# Patient Record
Sex: Male | Born: 1940 | Race: White | Hispanic: No | Marital: Married | Smoking: Never smoker
Health system: Southern US, Community
[De-identification: ages and names within clinical notes are randomized; demographics above are authoritative.]

## PROBLEM LIST (undated history)

## (undated) DIAGNOSIS — K449 Diaphragmatic hernia without obstruction or gangrene: Secondary | ICD-10-CM

## (undated) DIAGNOSIS — J449 Chronic obstructive pulmonary disease, unspecified: Secondary | ICD-10-CM

## (undated) DIAGNOSIS — D332 Benign neoplasm of brain, unspecified: Secondary | ICD-10-CM

## (undated) DIAGNOSIS — E039 Hypothyroidism, unspecified: Secondary | ICD-10-CM

## (undated) DIAGNOSIS — J45909 Unspecified asthma, uncomplicated: Secondary | ICD-10-CM

## (undated) DIAGNOSIS — C61 Malignant neoplasm of prostate: Secondary | ICD-10-CM

## (undated) DIAGNOSIS — E559 Vitamin D deficiency, unspecified: Secondary | ICD-10-CM

## (undated) HISTORY — DX: Chronic obstructive pulmonary disease, unspecified: J44.9

## (undated) HISTORY — DX: Hypothyroidism, unspecified: E03.9

## (undated) HISTORY — PX: PROSTATECTOMY: SHX69

## (undated) HISTORY — DX: Diaphragmatic hernia without obstruction or gangrene: K44.9

## (undated) HISTORY — DX: Vitamin D deficiency, unspecified: E55.9

## (undated) HISTORY — DX: Benign neoplasm of brain, unspecified: D33.2

## (undated) HISTORY — DX: Unspecified asthma, uncomplicated: J45.909

## (undated) HISTORY — DX: Malignant neoplasm of prostate: C61

## (undated) HISTORY — PX: BRAIN SURGERY: SHX531

---
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
## 1997-04-03 HISTORY — PX: BRAIN SURGERY: SHX531

## 1997-09-18 ENCOUNTER — Inpatient Hospital Stay (HOSPITAL_COMMUNITY): Admission: RE | Admit: 1997-09-18 | Discharge: 1997-09-21 | Payer: Self-pay | Admitting: Neurosurgery

## 1997-10-22 ENCOUNTER — Ambulatory Visit (HOSPITAL_COMMUNITY): Admission: RE | Admit: 1997-10-22 | Discharge: 1997-10-22 | Payer: Self-pay | Admitting: Neurosurgery

## 1997-11-20 ENCOUNTER — Ambulatory Visit (HOSPITAL_COMMUNITY): Admission: RE | Admit: 1997-11-20 | Discharge: 1997-11-20 | Payer: Self-pay | Admitting: Neurosurgery

## 2004-08-11 ENCOUNTER — Ambulatory Visit: Payer: Self-pay | Admitting: Oncology

## 2010-02-01 HISTORY — PX: CATARACT EXTRACTION: SUR2

## 2010-04-03 HISTORY — PX: CATARACT EXTRACTION: SUR2

## 2013-11-21 DIAGNOSIS — R972 Elevated prostate specific antigen [PSA]: Secondary | ICD-10-CM | POA: Insufficient documentation

## 2013-11-21 DIAGNOSIS — N138 Other obstructive and reflux uropathy: Secondary | ICD-10-CM | POA: Insufficient documentation

## 2013-11-21 DIAGNOSIS — B351 Tinea unguium: Secondary | ICD-10-CM

## 2013-11-21 DIAGNOSIS — M2040 Other hammer toe(s) (acquired), unspecified foot: Secondary | ICD-10-CM

## 2013-11-21 DIAGNOSIS — N401 Enlarged prostate with lower urinary tract symptoms: Secondary | ICD-10-CM | POA: Insufficient documentation

## 2013-11-21 DIAGNOSIS — M2010 Hallux valgus (acquired), unspecified foot: Secondary | ICD-10-CM | POA: Insufficient documentation

## 2013-11-21 HISTORY — DX: Other obstructive and reflux uropathy: N13.8

## 2013-11-21 HISTORY — DX: Hallux valgus (acquired), unspecified foot: M20.10

## 2013-11-21 HISTORY — DX: Tinea unguium: B35.1

## 2013-11-21 HISTORY — DX: Elevated prostate specific antigen (PSA): R97.20

## 2013-11-21 HISTORY — DX: Benign prostatic hyperplasia with lower urinary tract symptoms: N40.1

## 2013-11-21 HISTORY — DX: Other hammer toe(s) (acquired), unspecified foot: M20.40

## 2014-05-01 DIAGNOSIS — R972 Elevated prostate specific antigen [PSA]: Secondary | ICD-10-CM | POA: Diagnosis not present

## 2014-05-01 DIAGNOSIS — N401 Enlarged prostate with lower urinary tract symptoms: Secondary | ICD-10-CM | POA: Diagnosis not present

## 2014-05-08 DIAGNOSIS — R972 Elevated prostate specific antigen [PSA]: Secondary | ICD-10-CM | POA: Diagnosis not present

## 2014-06-17 DIAGNOSIS — N202 Calculus of kidney with calculus of ureter: Secondary | ICD-10-CM | POA: Diagnosis not present

## 2014-06-17 DIAGNOSIS — R112 Nausea with vomiting, unspecified: Secondary | ICD-10-CM | POA: Diagnosis not present

## 2014-06-17 DIAGNOSIS — M549 Dorsalgia, unspecified: Secondary | ICD-10-CM | POA: Diagnosis not present

## 2014-06-17 DIAGNOSIS — N200 Calculus of kidney: Secondary | ICD-10-CM | POA: Diagnosis not present

## 2014-06-17 DIAGNOSIS — R10814 Left lower quadrant abdominal tenderness: Secondary | ICD-10-CM | POA: Diagnosis not present

## 2014-06-17 DIAGNOSIS — N201 Calculus of ureter: Secondary | ICD-10-CM | POA: Diagnosis not present

## 2014-06-17 DIAGNOSIS — R1032 Left lower quadrant pain: Secondary | ICD-10-CM | POA: Diagnosis not present

## 2014-11-20 DIAGNOSIS — R972 Elevated prostate specific antigen [PSA]: Secondary | ICD-10-CM | POA: Diagnosis not present

## 2014-11-20 DIAGNOSIS — J018 Other acute sinusitis: Secondary | ICD-10-CM | POA: Diagnosis not present

## 2014-11-20 DIAGNOSIS — J0100 Acute maxillary sinusitis, unspecified: Secondary | ICD-10-CM | POA: Diagnosis not present

## 2014-11-27 DIAGNOSIS — N401 Enlarged prostate with lower urinary tract symptoms: Secondary | ICD-10-CM | POA: Diagnosis not present

## 2014-11-27 DIAGNOSIS — R972 Elevated prostate specific antigen [PSA]: Secondary | ICD-10-CM | POA: Diagnosis not present

## 2014-12-22 DIAGNOSIS — M5442 Lumbago with sciatica, left side: Secondary | ICD-10-CM | POA: Diagnosis not present

## 2014-12-25 DIAGNOSIS — M6281 Muscle weakness (generalized): Secondary | ICD-10-CM | POA: Diagnosis not present

## 2014-12-25 DIAGNOSIS — M2560 Stiffness of unspecified joint, not elsewhere classified: Secondary | ICD-10-CM | POA: Diagnosis not present

## 2014-12-25 DIAGNOSIS — Z23 Encounter for immunization: Secondary | ICD-10-CM | POA: Diagnosis not present

## 2014-12-25 DIAGNOSIS — M5442 Lumbago with sciatica, left side: Secondary | ICD-10-CM | POA: Diagnosis not present

## 2014-12-25 DIAGNOSIS — M79605 Pain in left leg: Secondary | ICD-10-CM | POA: Diagnosis not present

## 2014-12-25 DIAGNOSIS — R293 Abnormal posture: Secondary | ICD-10-CM | POA: Diagnosis not present

## 2014-12-25 DIAGNOSIS — R2689 Other abnormalities of gait and mobility: Secondary | ICD-10-CM | POA: Diagnosis not present

## 2014-12-29 DIAGNOSIS — M5442 Lumbago with sciatica, left side: Secondary | ICD-10-CM | POA: Diagnosis not present

## 2014-12-29 DIAGNOSIS — R293 Abnormal posture: Secondary | ICD-10-CM | POA: Diagnosis not present

## 2014-12-29 DIAGNOSIS — M79605 Pain in left leg: Secondary | ICD-10-CM | POA: Diagnosis not present

## 2014-12-29 DIAGNOSIS — M2560 Stiffness of unspecified joint, not elsewhere classified: Secondary | ICD-10-CM | POA: Diagnosis not present

## 2014-12-29 DIAGNOSIS — M6281 Muscle weakness (generalized): Secondary | ICD-10-CM | POA: Diagnosis not present

## 2014-12-29 DIAGNOSIS — R2689 Other abnormalities of gait and mobility: Secondary | ICD-10-CM | POA: Diagnosis not present

## 2015-01-01 DIAGNOSIS — M6281 Muscle weakness (generalized): Secondary | ICD-10-CM | POA: Diagnosis not present

## 2015-01-01 DIAGNOSIS — M79605 Pain in left leg: Secondary | ICD-10-CM | POA: Diagnosis not present

## 2015-01-01 DIAGNOSIS — M5442 Lumbago with sciatica, left side: Secondary | ICD-10-CM | POA: Diagnosis not present

## 2015-01-01 DIAGNOSIS — M2560 Stiffness of unspecified joint, not elsewhere classified: Secondary | ICD-10-CM | POA: Diagnosis not present

## 2015-01-01 DIAGNOSIS — R2689 Other abnormalities of gait and mobility: Secondary | ICD-10-CM | POA: Diagnosis not present

## 2015-01-01 DIAGNOSIS — R293 Abnormal posture: Secondary | ICD-10-CM | POA: Diagnosis not present

## 2015-01-05 DIAGNOSIS — R293 Abnormal posture: Secondary | ICD-10-CM | POA: Diagnosis not present

## 2015-01-05 DIAGNOSIS — R2689 Other abnormalities of gait and mobility: Secondary | ICD-10-CM | POA: Diagnosis not present

## 2015-01-05 DIAGNOSIS — M545 Low back pain: Secondary | ICD-10-CM | POA: Diagnosis not present

## 2015-01-05 DIAGNOSIS — M79605 Pain in left leg: Secondary | ICD-10-CM | POA: Diagnosis not present

## 2015-01-05 DIAGNOSIS — M5442 Lumbago with sciatica, left side: Secondary | ICD-10-CM | POA: Diagnosis not present

## 2015-01-05 DIAGNOSIS — M2560 Stiffness of unspecified joint, not elsewhere classified: Secondary | ICD-10-CM | POA: Diagnosis not present

## 2015-01-05 DIAGNOSIS — M6281 Muscle weakness (generalized): Secondary | ICD-10-CM | POA: Diagnosis not present

## 2015-01-08 DIAGNOSIS — M79605 Pain in left leg: Secondary | ICD-10-CM | POA: Diagnosis not present

## 2015-01-08 DIAGNOSIS — R2689 Other abnormalities of gait and mobility: Secondary | ICD-10-CM | POA: Diagnosis not present

## 2015-01-08 DIAGNOSIS — M2560 Stiffness of unspecified joint, not elsewhere classified: Secondary | ICD-10-CM | POA: Diagnosis not present

## 2015-01-08 DIAGNOSIS — M5442 Lumbago with sciatica, left side: Secondary | ICD-10-CM | POA: Diagnosis not present

## 2015-01-08 DIAGNOSIS — R293 Abnormal posture: Secondary | ICD-10-CM | POA: Diagnosis not present

## 2015-01-08 DIAGNOSIS — M545 Low back pain: Secondary | ICD-10-CM | POA: Diagnosis not present

## 2015-01-08 DIAGNOSIS — M6281 Muscle weakness (generalized): Secondary | ICD-10-CM | POA: Diagnosis not present

## 2015-01-12 DIAGNOSIS — M79605 Pain in left leg: Secondary | ICD-10-CM | POA: Diagnosis not present

## 2015-01-12 DIAGNOSIS — M6281 Muscle weakness (generalized): Secondary | ICD-10-CM | POA: Diagnosis not present

## 2015-01-12 DIAGNOSIS — R293 Abnormal posture: Secondary | ICD-10-CM | POA: Diagnosis not present

## 2015-01-12 DIAGNOSIS — M2560 Stiffness of unspecified joint, not elsewhere classified: Secondary | ICD-10-CM | POA: Diagnosis not present

## 2015-01-12 DIAGNOSIS — R2689 Other abnormalities of gait and mobility: Secondary | ICD-10-CM | POA: Diagnosis not present

## 2015-01-12 DIAGNOSIS — M545 Low back pain: Secondary | ICD-10-CM | POA: Diagnosis not present

## 2015-01-12 DIAGNOSIS — M5442 Lumbago with sciatica, left side: Secondary | ICD-10-CM | POA: Diagnosis not present

## 2015-01-15 DIAGNOSIS — M545 Low back pain: Secondary | ICD-10-CM | POA: Diagnosis not present

## 2015-01-15 DIAGNOSIS — R2689 Other abnormalities of gait and mobility: Secondary | ICD-10-CM | POA: Diagnosis not present

## 2015-01-15 DIAGNOSIS — M79605 Pain in left leg: Secondary | ICD-10-CM | POA: Diagnosis not present

## 2015-01-15 DIAGNOSIS — M2560 Stiffness of unspecified joint, not elsewhere classified: Secondary | ICD-10-CM | POA: Diagnosis not present

## 2015-01-15 DIAGNOSIS — M6281 Muscle weakness (generalized): Secondary | ICD-10-CM | POA: Diagnosis not present

## 2015-01-15 DIAGNOSIS — R293 Abnormal posture: Secondary | ICD-10-CM | POA: Diagnosis not present

## 2015-01-15 DIAGNOSIS — M5442 Lumbago with sciatica, left side: Secondary | ICD-10-CM | POA: Diagnosis not present

## 2015-01-18 DIAGNOSIS — M79605 Pain in left leg: Secondary | ICD-10-CM | POA: Diagnosis not present

## 2015-01-18 DIAGNOSIS — M2560 Stiffness of unspecified joint, not elsewhere classified: Secondary | ICD-10-CM | POA: Diagnosis not present

## 2015-01-18 DIAGNOSIS — M6281 Muscle weakness (generalized): Secondary | ICD-10-CM | POA: Diagnosis not present

## 2015-01-18 DIAGNOSIS — M5442 Lumbago with sciatica, left side: Secondary | ICD-10-CM | POA: Diagnosis not present

## 2015-01-18 DIAGNOSIS — R293 Abnormal posture: Secondary | ICD-10-CM | POA: Diagnosis not present

## 2015-01-18 DIAGNOSIS — R2689 Other abnormalities of gait and mobility: Secondary | ICD-10-CM | POA: Diagnosis not present

## 2015-01-18 DIAGNOSIS — M545 Low back pain: Secondary | ICD-10-CM | POA: Diagnosis not present

## 2015-01-20 DIAGNOSIS — R2689 Other abnormalities of gait and mobility: Secondary | ICD-10-CM | POA: Diagnosis not present

## 2015-01-20 DIAGNOSIS — M5442 Lumbago with sciatica, left side: Secondary | ICD-10-CM | POA: Diagnosis not present

## 2015-01-20 DIAGNOSIS — M545 Low back pain: Secondary | ICD-10-CM | POA: Diagnosis not present

## 2015-01-20 DIAGNOSIS — R293 Abnormal posture: Secondary | ICD-10-CM | POA: Diagnosis not present

## 2015-01-20 DIAGNOSIS — M79605 Pain in left leg: Secondary | ICD-10-CM | POA: Diagnosis not present

## 2015-01-20 DIAGNOSIS — M6281 Muscle weakness (generalized): Secondary | ICD-10-CM | POA: Diagnosis not present

## 2015-01-20 DIAGNOSIS — M2560 Stiffness of unspecified joint, not elsewhere classified: Secondary | ICD-10-CM | POA: Diagnosis not present

## 2015-01-22 DIAGNOSIS — M4317 Spondylolisthesis, lumbosacral region: Secondary | ICD-10-CM | POA: Diagnosis not present

## 2015-01-22 DIAGNOSIS — M5442 Lumbago with sciatica, left side: Secondary | ICD-10-CM | POA: Diagnosis not present

## 2015-01-22 DIAGNOSIS — M5416 Radiculopathy, lumbar region: Secondary | ICD-10-CM | POA: Diagnosis not present

## 2015-01-26 DIAGNOSIS — M5442 Lumbago with sciatica, left side: Secondary | ICD-10-CM | POA: Diagnosis not present

## 2015-01-26 DIAGNOSIS — M79605 Pain in left leg: Secondary | ICD-10-CM | POA: Diagnosis not present

## 2015-01-26 DIAGNOSIS — R293 Abnormal posture: Secondary | ICD-10-CM | POA: Diagnosis not present

## 2015-01-26 DIAGNOSIS — M545 Low back pain: Secondary | ICD-10-CM | POA: Diagnosis not present

## 2015-01-26 DIAGNOSIS — R2689 Other abnormalities of gait and mobility: Secondary | ICD-10-CM | POA: Diagnosis not present

## 2015-01-26 DIAGNOSIS — M2560 Stiffness of unspecified joint, not elsewhere classified: Secondary | ICD-10-CM | POA: Diagnosis not present

## 2015-01-26 DIAGNOSIS — M6281 Muscle weakness (generalized): Secondary | ICD-10-CM | POA: Diagnosis not present

## 2015-01-29 DIAGNOSIS — R2689 Other abnormalities of gait and mobility: Secondary | ICD-10-CM | POA: Diagnosis not present

## 2015-01-29 DIAGNOSIS — M2560 Stiffness of unspecified joint, not elsewhere classified: Secondary | ICD-10-CM | POA: Diagnosis not present

## 2015-01-29 DIAGNOSIS — M79605 Pain in left leg: Secondary | ICD-10-CM | POA: Diagnosis not present

## 2015-01-29 DIAGNOSIS — M545 Low back pain: Secondary | ICD-10-CM | POA: Diagnosis not present

## 2015-01-29 DIAGNOSIS — R293 Abnormal posture: Secondary | ICD-10-CM | POA: Diagnosis not present

## 2015-01-29 DIAGNOSIS — M5442 Lumbago with sciatica, left side: Secondary | ICD-10-CM | POA: Diagnosis not present

## 2015-01-29 DIAGNOSIS — M6281 Muscle weakness (generalized): Secondary | ICD-10-CM | POA: Diagnosis not present

## 2015-02-03 DIAGNOSIS — R2689 Other abnormalities of gait and mobility: Secondary | ICD-10-CM | POA: Diagnosis not present

## 2015-02-03 DIAGNOSIS — M6281 Muscle weakness (generalized): Secondary | ICD-10-CM | POA: Diagnosis not present

## 2015-02-03 DIAGNOSIS — R293 Abnormal posture: Secondary | ICD-10-CM | POA: Diagnosis not present

## 2015-02-03 DIAGNOSIS — M2560 Stiffness of unspecified joint, not elsewhere classified: Secondary | ICD-10-CM | POA: Diagnosis not present

## 2015-02-03 DIAGNOSIS — M79605 Pain in left leg: Secondary | ICD-10-CM | POA: Diagnosis not present

## 2015-02-03 DIAGNOSIS — M5442 Lumbago with sciatica, left side: Secondary | ICD-10-CM | POA: Diagnosis not present

## 2015-02-05 DIAGNOSIS — R293 Abnormal posture: Secondary | ICD-10-CM | POA: Diagnosis not present

## 2015-02-05 DIAGNOSIS — M6281 Muscle weakness (generalized): Secondary | ICD-10-CM | POA: Diagnosis not present

## 2015-02-05 DIAGNOSIS — R2689 Other abnormalities of gait and mobility: Secondary | ICD-10-CM | POA: Diagnosis not present

## 2015-02-05 DIAGNOSIS — M5442 Lumbago with sciatica, left side: Secondary | ICD-10-CM | POA: Diagnosis not present

## 2015-02-05 DIAGNOSIS — M2560 Stiffness of unspecified joint, not elsewhere classified: Secondary | ICD-10-CM | POA: Diagnosis not present

## 2015-02-05 DIAGNOSIS — M79605 Pain in left leg: Secondary | ICD-10-CM | POA: Diagnosis not present

## 2015-02-10 DIAGNOSIS — R2689 Other abnormalities of gait and mobility: Secondary | ICD-10-CM | POA: Diagnosis not present

## 2015-02-10 DIAGNOSIS — R293 Abnormal posture: Secondary | ICD-10-CM | POA: Diagnosis not present

## 2015-02-10 DIAGNOSIS — M2560 Stiffness of unspecified joint, not elsewhere classified: Secondary | ICD-10-CM | POA: Diagnosis not present

## 2015-02-10 DIAGNOSIS — M5442 Lumbago with sciatica, left side: Secondary | ICD-10-CM | POA: Diagnosis not present

## 2015-02-10 DIAGNOSIS — M79605 Pain in left leg: Secondary | ICD-10-CM | POA: Diagnosis not present

## 2015-02-10 DIAGNOSIS — M6281 Muscle weakness (generalized): Secondary | ICD-10-CM | POA: Diagnosis not present

## 2015-02-12 DIAGNOSIS — M5442 Lumbago with sciatica, left side: Secondary | ICD-10-CM | POA: Diagnosis not present

## 2015-02-12 DIAGNOSIS — M2560 Stiffness of unspecified joint, not elsewhere classified: Secondary | ICD-10-CM | POA: Diagnosis not present

## 2015-02-12 DIAGNOSIS — R293 Abnormal posture: Secondary | ICD-10-CM | POA: Diagnosis not present

## 2015-02-12 DIAGNOSIS — M79605 Pain in left leg: Secondary | ICD-10-CM | POA: Diagnosis not present

## 2015-02-12 DIAGNOSIS — M6281 Muscle weakness (generalized): Secondary | ICD-10-CM | POA: Diagnosis not present

## 2015-02-12 DIAGNOSIS — R2689 Other abnormalities of gait and mobility: Secondary | ICD-10-CM | POA: Diagnosis not present

## 2015-02-16 DIAGNOSIS — M79605 Pain in left leg: Secondary | ICD-10-CM | POA: Diagnosis not present

## 2015-02-16 DIAGNOSIS — R2689 Other abnormalities of gait and mobility: Secondary | ICD-10-CM | POA: Diagnosis not present

## 2015-02-16 DIAGNOSIS — M6281 Muscle weakness (generalized): Secondary | ICD-10-CM | POA: Diagnosis not present

## 2015-02-16 DIAGNOSIS — M5442 Lumbago with sciatica, left side: Secondary | ICD-10-CM | POA: Diagnosis not present

## 2015-02-16 DIAGNOSIS — M2560 Stiffness of unspecified joint, not elsewhere classified: Secondary | ICD-10-CM | POA: Diagnosis not present

## 2015-02-16 DIAGNOSIS — R293 Abnormal posture: Secondary | ICD-10-CM | POA: Diagnosis not present

## 2015-03-10 DIAGNOSIS — M5442 Lumbago with sciatica, left side: Secondary | ICD-10-CM | POA: Diagnosis not present

## 2015-03-10 DIAGNOSIS — M4317 Spondylolisthesis, lumbosacral region: Secondary | ICD-10-CM | POA: Diagnosis not present

## 2015-03-10 DIAGNOSIS — M5416 Radiculopathy, lumbar region: Secondary | ICD-10-CM | POA: Diagnosis not present

## 2015-03-12 DIAGNOSIS — R972 Elevated prostate specific antigen [PSA]: Secondary | ICD-10-CM | POA: Diagnosis not present

## 2015-03-19 DIAGNOSIS — N400 Enlarged prostate without lower urinary tract symptoms: Secondary | ICD-10-CM | POA: Diagnosis not present

## 2015-03-19 DIAGNOSIS — R972 Elevated prostate specific antigen [PSA]: Secondary | ICD-10-CM | POA: Diagnosis not present

## 2015-03-30 DIAGNOSIS — R972 Elevated prostate specific antigen [PSA]: Secondary | ICD-10-CM | POA: Diagnosis not present

## 2015-03-31 DIAGNOSIS — J018 Other acute sinusitis: Secondary | ICD-10-CM | POA: Diagnosis not present

## 2015-10-15 DIAGNOSIS — R972 Elevated prostate specific antigen [PSA]: Secondary | ICD-10-CM | POA: Diagnosis not present

## 2015-10-22 DIAGNOSIS — N138 Other obstructive and reflux uropathy: Secondary | ICD-10-CM | POA: Diagnosis not present

## 2015-10-22 DIAGNOSIS — N401 Enlarged prostate with lower urinary tract symptoms: Secondary | ICD-10-CM | POA: Diagnosis not present

## 2015-10-22 DIAGNOSIS — R972 Elevated prostate specific antigen [PSA]: Secondary | ICD-10-CM | POA: Diagnosis not present

## 2015-12-27 DIAGNOSIS — R972 Elevated prostate specific antigen [PSA]: Secondary | ICD-10-CM | POA: Diagnosis not present

## 2015-12-27 DIAGNOSIS — N401 Enlarged prostate with lower urinary tract symptoms: Secondary | ICD-10-CM | POA: Diagnosis not present

## 2015-12-27 DIAGNOSIS — N400 Enlarged prostate without lower urinary tract symptoms: Secondary | ICD-10-CM | POA: Diagnosis not present

## 2015-12-27 DIAGNOSIS — C61 Malignant neoplasm of prostate: Secondary | ICD-10-CM | POA: Diagnosis not present

## 2016-01-03 DIAGNOSIS — R972 Elevated prostate specific antigen [PSA]: Secondary | ICD-10-CM | POA: Diagnosis not present

## 2016-01-03 DIAGNOSIS — N400 Enlarged prostate without lower urinary tract symptoms: Secondary | ICD-10-CM | POA: Diagnosis not present

## 2016-01-13 DIAGNOSIS — C61 Malignant neoplasm of prostate: Secondary | ICD-10-CM | POA: Diagnosis not present

## 2016-01-13 DIAGNOSIS — R972 Elevated prostate specific antigen [PSA]: Secondary | ICD-10-CM | POA: Diagnosis not present

## 2016-01-13 DIAGNOSIS — N400 Enlarged prostate without lower urinary tract symptoms: Secondary | ICD-10-CM | POA: Diagnosis not present

## 2016-01-20 DIAGNOSIS — Z23 Encounter for immunization: Secondary | ICD-10-CM | POA: Diagnosis not present

## 2016-01-25 DIAGNOSIS — R972 Elevated prostate specific antigen [PSA]: Secondary | ICD-10-CM | POA: Diagnosis not present

## 2016-02-16 DIAGNOSIS — N138 Other obstructive and reflux uropathy: Secondary | ICD-10-CM | POA: Diagnosis not present

## 2016-02-16 DIAGNOSIS — N401 Enlarged prostate with lower urinary tract symptoms: Secondary | ICD-10-CM | POA: Diagnosis not present

## 2016-02-22 DIAGNOSIS — R972 Elevated prostate specific antigen [PSA]: Secondary | ICD-10-CM | POA: Diagnosis not present

## 2016-02-22 DIAGNOSIS — N418 Other inflammatory diseases of prostate: Secondary | ICD-10-CM | POA: Diagnosis not present

## 2016-02-22 DIAGNOSIS — N401 Enlarged prostate with lower urinary tract symptoms: Secondary | ICD-10-CM | POA: Diagnosis not present

## 2016-02-22 DIAGNOSIS — C61 Malignant neoplasm of prostate: Secondary | ICD-10-CM | POA: Diagnosis not present

## 2016-02-22 HISTORY — PX: PROSTATECTOMY: SHX69

## 2016-03-10 DIAGNOSIS — N401 Enlarged prostate with lower urinary tract symptoms: Secondary | ICD-10-CM | POA: Diagnosis not present

## 2016-03-10 DIAGNOSIS — N138 Other obstructive and reflux uropathy: Secondary | ICD-10-CM | POA: Diagnosis not present

## 2016-03-21 DIAGNOSIS — C61 Malignant neoplasm of prostate: Secondary | ICD-10-CM | POA: Insufficient documentation

## 2016-04-03 HISTORY — PX: LITHOTRIPSY: SUR834

## 2016-05-10 DIAGNOSIS — N400 Enlarged prostate without lower urinary tract symptoms: Secondary | ICD-10-CM | POA: Diagnosis not present

## 2016-05-10 DIAGNOSIS — R972 Elevated prostate specific antigen [PSA]: Secondary | ICD-10-CM | POA: Diagnosis not present

## 2016-05-10 DIAGNOSIS — R8271 Bacteriuria: Secondary | ICD-10-CM | POA: Diagnosis not present

## 2016-06-21 DIAGNOSIS — Z0000 Encounter for general adult medical examination without abnormal findings: Secondary | ICD-10-CM | POA: Diagnosis not present

## 2016-06-21 DIAGNOSIS — C61 Malignant neoplasm of prostate: Secondary | ICD-10-CM | POA: Diagnosis not present

## 2016-07-05 DIAGNOSIS — N400 Enlarged prostate without lower urinary tract symptoms: Secondary | ICD-10-CM | POA: Diagnosis not present

## 2016-07-18 DIAGNOSIS — C61 Malignant neoplasm of prostate: Secondary | ICD-10-CM | POA: Diagnosis not present

## 2016-07-18 DIAGNOSIS — N200 Calculus of kidney: Secondary | ICD-10-CM | POA: Diagnosis not present

## 2016-07-18 DIAGNOSIS — Z136 Encounter for screening for cardiovascular disorders: Secondary | ICD-10-CM | POA: Diagnosis not present

## 2016-09-21 DIAGNOSIS — N400 Enlarged prostate without lower urinary tract symptoms: Secondary | ICD-10-CM | POA: Diagnosis not present

## 2016-09-21 DIAGNOSIS — R972 Elevated prostate specific antigen [PSA]: Secondary | ICD-10-CM | POA: Diagnosis not present

## 2016-09-28 DIAGNOSIS — N400 Enlarged prostate without lower urinary tract symptoms: Secondary | ICD-10-CM | POA: Diagnosis not present

## 2016-10-05 DIAGNOSIS — R109 Unspecified abdominal pain: Secondary | ICD-10-CM | POA: Diagnosis not present

## 2016-10-05 DIAGNOSIS — N200 Calculus of kidney: Secondary | ICD-10-CM | POA: Diagnosis not present

## 2016-10-06 DIAGNOSIS — D3022 Benign neoplasm of left ureter: Secondary | ICD-10-CM | POA: Diagnosis not present

## 2016-10-06 DIAGNOSIS — N2889 Other specified disorders of kidney and ureter: Secondary | ICD-10-CM | POA: Diagnosis not present

## 2016-10-06 DIAGNOSIS — C61 Malignant neoplasm of prostate: Secondary | ICD-10-CM | POA: Diagnosis not present

## 2016-10-06 DIAGNOSIS — N201 Calculus of ureter: Secondary | ICD-10-CM | POA: Diagnosis not present

## 2016-10-17 DIAGNOSIS — N200 Calculus of kidney: Secondary | ICD-10-CM | POA: Diagnosis not present

## 2016-10-20 DIAGNOSIS — N200 Calculus of kidney: Secondary | ICD-10-CM | POA: Diagnosis not present

## 2016-10-20 DIAGNOSIS — R8271 Bacteriuria: Secondary | ICD-10-CM | POA: Diagnosis not present

## 2016-10-24 DIAGNOSIS — N200 Calculus of kidney: Secondary | ICD-10-CM | POA: Diagnosis not present

## 2016-12-11 DIAGNOSIS — N400 Enlarged prostate without lower urinary tract symptoms: Secondary | ICD-10-CM | POA: Diagnosis not present

## 2016-12-18 DIAGNOSIS — N401 Enlarged prostate with lower urinary tract symptoms: Secondary | ICD-10-CM | POA: Diagnosis not present

## 2016-12-18 DIAGNOSIS — N200 Calculus of kidney: Secondary | ICD-10-CM | POA: Diagnosis not present

## 2016-12-18 DIAGNOSIS — N138 Other obstructive and reflux uropathy: Secondary | ICD-10-CM | POA: Diagnosis not present

## 2017-01-04 DIAGNOSIS — Z23 Encounter for immunization: Secondary | ICD-10-CM | POA: Diagnosis not present

## 2017-01-30 DIAGNOSIS — M778 Other enthesopathies, not elsewhere classified: Secondary | ICD-10-CM | POA: Diagnosis not present

## 2017-03-07 DIAGNOSIS — Z1331 Encounter for screening for depression: Secondary | ICD-10-CM | POA: Diagnosis not present

## 2017-03-07 DIAGNOSIS — Z125 Encounter for screening for malignant neoplasm of prostate: Secondary | ICD-10-CM | POA: Diagnosis not present

## 2017-03-07 DIAGNOSIS — E785 Hyperlipidemia, unspecified: Secondary | ICD-10-CM | POA: Diagnosis not present

## 2017-03-07 DIAGNOSIS — Z139 Encounter for screening, unspecified: Secondary | ICD-10-CM | POA: Diagnosis not present

## 2017-03-07 DIAGNOSIS — Z0000 Encounter for general adult medical examination without abnormal findings: Secondary | ICD-10-CM | POA: Diagnosis not present

## 2017-03-07 DIAGNOSIS — Z9181 History of falling: Secondary | ICD-10-CM | POA: Diagnosis not present

## 2017-06-18 DIAGNOSIS — N138 Other obstructive and reflux uropathy: Secondary | ICD-10-CM | POA: Diagnosis not present

## 2017-06-18 DIAGNOSIS — N401 Enlarged prostate with lower urinary tract symptoms: Secondary | ICD-10-CM | POA: Diagnosis not present

## 2017-06-25 DIAGNOSIS — N401 Enlarged prostate with lower urinary tract symptoms: Secondary | ICD-10-CM | POA: Diagnosis not present

## 2017-06-25 DIAGNOSIS — N138 Other obstructive and reflux uropathy: Secondary | ICD-10-CM | POA: Diagnosis not present

## 2017-06-28 DIAGNOSIS — R1031 Right lower quadrant pain: Secondary | ICD-10-CM | POA: Diagnosis not present

## 2017-12-11 DIAGNOSIS — N138 Other obstructive and reflux uropathy: Secondary | ICD-10-CM | POA: Diagnosis not present

## 2017-12-12 DIAGNOSIS — R0982 Postnasal drip: Secondary | ICD-10-CM | POA: Diagnosis not present

## 2017-12-12 DIAGNOSIS — R062 Wheezing: Secondary | ICD-10-CM | POA: Diagnosis not present

## 2017-12-18 DIAGNOSIS — N138 Other obstructive and reflux uropathy: Secondary | ICD-10-CM | POA: Diagnosis not present

## 2017-12-18 DIAGNOSIS — N200 Calculus of kidney: Secondary | ICD-10-CM | POA: Diagnosis not present

## 2018-01-08 DIAGNOSIS — J45909 Unspecified asthma, uncomplicated: Secondary | ICD-10-CM | POA: Diagnosis not present

## 2018-01-09 DIAGNOSIS — J45909 Unspecified asthma, uncomplicated: Secondary | ICD-10-CM | POA: Diagnosis not present

## 2018-01-19 DIAGNOSIS — Z23 Encounter for immunization: Secondary | ICD-10-CM | POA: Diagnosis not present

## 2018-02-01 DIAGNOSIS — R062 Wheezing: Secondary | ICD-10-CM | POA: Diagnosis not present

## 2018-02-05 DIAGNOSIS — R062 Wheezing: Secondary | ICD-10-CM | POA: Diagnosis not present

## 2018-02-09 DIAGNOSIS — J45909 Unspecified asthma, uncomplicated: Secondary | ICD-10-CM | POA: Diagnosis not present

## 2018-02-12 DIAGNOSIS — J45909 Unspecified asthma, uncomplicated: Secondary | ICD-10-CM | POA: Diagnosis not present

## 2018-03-11 DIAGNOSIS — J45909 Unspecified asthma, uncomplicated: Secondary | ICD-10-CM | POA: Diagnosis not present

## 2018-04-02 DIAGNOSIS — Z9181 History of falling: Secondary | ICD-10-CM | POA: Diagnosis not present

## 2018-04-02 DIAGNOSIS — Z0000 Encounter for general adult medical examination without abnormal findings: Secondary | ICD-10-CM | POA: Diagnosis not present

## 2018-04-02 DIAGNOSIS — Z139 Encounter for screening, unspecified: Secondary | ICD-10-CM | POA: Diagnosis not present

## 2018-04-08 DIAGNOSIS — J449 Chronic obstructive pulmonary disease, unspecified: Secondary | ICD-10-CM | POA: Diagnosis not present

## 2018-04-08 DIAGNOSIS — J441 Chronic obstructive pulmonary disease with (acute) exacerbation: Secondary | ICD-10-CM | POA: Diagnosis not present

## 2018-04-08 DIAGNOSIS — R05 Cough: Secondary | ICD-10-CM | POA: Diagnosis not present

## 2018-04-11 DIAGNOSIS — J45909 Unspecified asthma, uncomplicated: Secondary | ICD-10-CM | POA: Diagnosis not present

## 2018-04-15 DIAGNOSIS — R062 Wheezing: Secondary | ICD-10-CM | POA: Diagnosis not present

## 2018-04-15 DIAGNOSIS — J330 Polyp of nasal cavity: Secondary | ICD-10-CM | POA: Diagnosis not present

## 2018-04-17 DIAGNOSIS — J45909 Unspecified asthma, uncomplicated: Secondary | ICD-10-CM | POA: Diagnosis not present

## 2018-04-24 DIAGNOSIS — G9389 Other specified disorders of brain: Secondary | ICD-10-CM | POA: Diagnosis not present

## 2018-04-24 DIAGNOSIS — R062 Wheezing: Secondary | ICD-10-CM | POA: Diagnosis not present

## 2018-04-24 DIAGNOSIS — J328 Other chronic sinusitis: Secondary | ICD-10-CM | POA: Diagnosis not present

## 2018-04-24 DIAGNOSIS — J330 Polyp of nasal cavity: Secondary | ICD-10-CM | POA: Diagnosis not present

## 2018-04-24 DIAGNOSIS — R918 Other nonspecific abnormal finding of lung field: Secondary | ICD-10-CM | POA: Diagnosis not present

## 2018-05-12 DIAGNOSIS — J45909 Unspecified asthma, uncomplicated: Secondary | ICD-10-CM | POA: Diagnosis not present

## 2018-05-23 DIAGNOSIS — J45909 Unspecified asthma, uncomplicated: Secondary | ICD-10-CM | POA: Diagnosis not present

## 2018-05-31 ENCOUNTER — Ambulatory Visit: Payer: Medicare Other | Admitting: Pulmonary Disease

## 2018-05-31 ENCOUNTER — Encounter: Payer: Self-pay | Admitting: Pulmonary Disease

## 2018-05-31 VITALS — BP 144/80 | HR 86 | Ht 67.75 in | Wt 170.8 lb

## 2018-05-31 DIAGNOSIS — R062 Wheezing: Secondary | ICD-10-CM

## 2018-05-31 DIAGNOSIS — J4520 Mild intermittent asthma, uncomplicated: Secondary | ICD-10-CM | POA: Diagnosis not present

## 2018-05-31 NOTE — Patient Instructions (Signed)
Mild intermittent asthma: In the event of chest tightness wheezing or shortness of breath use albuterol 2 puffs every 4-6 hours as needed If he have to use albuterol more than 2 times a week need to know that If he have a severe case of coughing and wheezing again please come back to our office so that we can see you Practice good hand hygiene Stay active I am glad your immunizations are up-to-date  We will see you back in 3 months or sooner if needed

## 2018-05-31 NOTE — Progress Notes (Signed)
Synopsis: Referred in February 2020 for coughing, wheezing, mucus production  Subjective:   PATIENT ID: Ivan Day GENDER: male DOB: 1940-11-20, MRN: [id number]   HPI  Chief Complaint  Patient presents with  . Pulmonary Consult    referred by Dr. Romeo Apple (PCP) [facility] in [place] for suspected COPD & asthma; wheezing since Oct. 2019 w/ cough & congestion    Cauy says that in mid October he started wheezing > he went to his PCP and was Rx'd prednisone which helped > however the symptoms recurred so he had to go back and was treated again with prednisone > he says that after three treatments of prednisone his symptoms improved > he started using nebulized medicines for the first time > around the time he was sick he had mucus production with his cough > he has not had fever at all during this time > no sick contacts in October > no mold, mildew in the house > he has humidifier in the house that haven't changed  He has never smoked cigarettes.  He worked in [employer].  He worked for [employer].  He was in Tax inspector.    He has a history of nasal polyps and has seen ENT for this.  He's never had surgery for that.  He says that as a child he had asthma when he was very young, but he really doesn't it being an issue for him over the years until lately.   He denies post nasal drip or acid reflux.    Past Medical History:  Diagnosis Date  . Asthma   . COPD (chronic obstructive pulmonary disease) ([place])   . Prostate cancer [facility])      Family History  Problem Relation Age of Onset  . Heart attack Mother   . Diabetes Mother   . Coronary artery disease Father   . Diabetes Sister   . Heart attack Brother   . Diabetes Sister      Social History   Socioeconomic History  . Marital status: Married    Spouse name: Not on file  . Number of children: Not on file  . Years of education: Not on file  . Highest education level: Not on file    Occupational History  . Not on file  Social Needs  . Financial resource strain: Not on file  . Food insecurity:    Worry: Not on file    Inability: Not on file  . Transportation needs:    Medical: Not on file    Non-medical: Not on file  Tobacco Use  . Smoking status: Never Smoker  . Smokeless tobacco: Never Used  Substance and Sexual Activity  . Alcohol use: Never    Frequency: Never  . Drug use: Not Currently  . Sexual activity: Not on file  Lifestyle  . Physical activity:    Days per week: Not on file    Minutes per session: Not on file  . Stress: Not on file  Relationships  . Social connections:    Talks on phone: Not on file    Gets together: Not on file    Attends religious service: Not on file    Active member of club or organization: Not on file    Attends meetings of clubs or organizations: Not on file    Relationship status: Not on file  . Intimate partner violence:    Fear of current or ex partner: Not on file    Emotionally  abused: Not on file    Physically abused: Not on file    Forced sexual activity: Not on file  Other Topics Concern  . Not on file  Social History Narrative  . Not on file     Allergies  Allergen Reactions  . Naproxen Hives     Outpatient Medications Prior to Visit  Medication Sig Dispense Refill  . co-enzyme Q-10 30 MG capsule Take 100 mg by mouth daily.    . Multiple Vitamin (MULTIVITAMIN) tablet Take 1 tablet by mouth daily.    . Omega-3 Fatty Acids (FISH OIL) 1200 MG CAPS Take 1,200 mg by mouth 2 (two) times daily.     No facility-administered medications prior to visit.     Review of Systems  Constitutional: Negative for chills, fever, malaise/fatigue and weight loss.  HENT: Positive for congestion. Negative for nosebleeds, sinus pain and sore throat.   Eyes: Negative for photophobia, pain and discharge.  Respiratory: Positive for cough and wheezing. Negative for hemoptysis, sputum production and shortness of breath.    Cardiovascular: Negative for chest pain, palpitations, orthopnea and leg swelling.  Gastrointestinal: Negative for abdominal pain, constipation, diarrhea, nausea and vomiting.  Genitourinary: Negative for dysuria, frequency, hematuria and urgency.  Musculoskeletal: Negative for back pain, joint pain, myalgias and neck pain.  Skin: Negative for itching and rash.  Neurological: Negative for tingling, tremors, sensory change, speech change, focal weakness, seizures, weakness and headaches.  Psychiatric/Behavioral: Negative for memory loss, substance abuse and suicidal ideas. The patient is not nervous/anxious.       Objective:  Physical Exam   Vitals:   05/31/18 1440  BP: (!) 144/80  Pulse: 86  SpO2: 95%  Weight: 170 lb 12.8 oz (77.5 kg)  Height: 5' 7.75" (1.721 m)   RA  Gen: well appearing, no acute distress HENT: NCAT, OP clear, neck supple without masses Eyes: PERRL, EOMi Lymph: no cervical lymphadenopathy PULM: CTA B CV: RRR, no mgr, no JVD GI: BS+, soft, nontender, no hsm Derm: no rash or skin breakdown MSK: normal bulk and tone Neuro: A&Ox4, CN II-XII intact, strength 5/5 in all 4 extremities Psyche: normal mood and affect   CBC No results found for: WBC, RBC, HGB, HCT, PLT, MCV, MCH, MCHC, RDW, LYMPHSABS, MONOABS, EOSABS, BASOSABS   Chest imaging: 04/2018 CXR images independently reviewed showing normal pulmonary parenchyma, no cardiac abnormality  PFT: November 2019 spirometry at doctors up in [place] showed ratio of 68%, FEV1 of 2.14 L 70% predicted which improved to 2.21 L and a normal ratio 72% predicted after bronchodilator, some improvement in small airways  Exhaled NO 05/2018: 15 ppb  Labs:  Path:  Echo:  Heart Catheterization:       Assessment & Plan:   Wheezing - Plan: Nitric oxide, CANCELED: POCT EXHALED NITRIC OXIDE  Mild intermittent asthma without complication  Discussion: Heaton has mild intermittent asthma.  This goes along with  his diagnosis of nasal polyposis.  At this time I do not think he needs more treatment because he is completely asymptomatic and I am hopeful that his symptoms will not recur.  However, if he has recurrent symptoms then we will need to put him on a controller medicine.  He knows how to use his albuterol.  Plan: Mild intermittent asthma: In the event of chest tightness wheezing or shortness of breath use albuterol 2 puffs every 4-6 hours as needed If he have to use albuterol more than 2 times a week need to know that If he  have a severe case of coughing and wheezing again please come back to our office so that we can see you Practice good hand hygiene Stay active I am glad your immunizations are up-to-date  We will see you back in 3 months or sooner if needed  Greater than 50% of the 60-minute visit was spent face-to-face   Current Outpatient Medications:  .  co-enzyme Q-10 30 MG capsule, Take 100 mg by mouth daily., Disp: , Rfl:  .  Multiple Vitamin (MULTIVITAMIN) tablet, Take 1 tablet by mouth daily., Disp: , Rfl:  .  Omega-3 Fatty Acids (FISH OIL) 1200 MG CAPS, Take 1,200 mg by mouth 2 (two) times daily., Disp: , Rfl:

## 2018-06-10 DIAGNOSIS — J45909 Unspecified asthma, uncomplicated: Secondary | ICD-10-CM | POA: Diagnosis not present

## 2018-07-11 DIAGNOSIS — J45909 Unspecified asthma, uncomplicated: Secondary | ICD-10-CM | POA: Diagnosis not present

## 2018-08-10 DIAGNOSIS — J45909 Unspecified asthma, uncomplicated: Secondary | ICD-10-CM | POA: Diagnosis not present

## 2018-08-30 ENCOUNTER — Ambulatory Visit: Payer: Medicare Other | Admitting: Pulmonary Disease

## 2018-09-10 DIAGNOSIS — J45909 Unspecified asthma, uncomplicated: Secondary | ICD-10-CM | POA: Diagnosis not present

## 2018-10-10 DIAGNOSIS — J45909 Unspecified asthma, uncomplicated: Secondary | ICD-10-CM | POA: Diagnosis not present

## 2018-10-14 DIAGNOSIS — M25561 Pain in right knee: Secondary | ICD-10-CM | POA: Diagnosis not present

## 2018-10-14 DIAGNOSIS — G4700 Insomnia, unspecified: Secondary | ICD-10-CM | POA: Diagnosis not present

## 2018-10-14 DIAGNOSIS — R7301 Impaired fasting glucose: Secondary | ICD-10-CM | POA: Diagnosis not present

## 2018-10-14 DIAGNOSIS — J4520 Mild intermittent asthma, uncomplicated: Secondary | ICD-10-CM | POA: Diagnosis not present

## 2018-10-16 DIAGNOSIS — J45909 Unspecified asthma, uncomplicated: Secondary | ICD-10-CM | POA: Diagnosis not present

## 2018-10-21 ENCOUNTER — Ambulatory Visit (INDEPENDENT_AMBULATORY_CARE_PROVIDER_SITE_OTHER): Payer: Medicare Other | Admitting: Pulmonary Disease

## 2018-10-21 ENCOUNTER — Other Ambulatory Visit: Payer: Self-pay

## 2018-10-21 ENCOUNTER — Encounter: Payer: Self-pay | Admitting: Pulmonary Disease

## 2018-10-21 ENCOUNTER — Telehealth: Payer: Self-pay | Admitting: Pulmonary Disease

## 2018-10-21 DIAGNOSIS — J4540 Moderate persistent asthma, uncomplicated: Secondary | ICD-10-CM | POA: Insufficient documentation

## 2018-10-21 DIAGNOSIS — R062 Wheezing: Secondary | ICD-10-CM | POA: Insufficient documentation

## 2018-10-21 DIAGNOSIS — J45909 Unspecified asthma, uncomplicated: Secondary | ICD-10-CM | POA: Insufficient documentation

## 2018-10-21 DIAGNOSIS — J4520 Mild intermittent asthma, uncomplicated: Secondary | ICD-10-CM

## 2018-10-21 HISTORY — DX: Wheezing: R06.2

## 2018-10-21 HISTORY — DX: Moderate persistent asthma, uncomplicated: J45.40

## 2018-10-21 MED ORDER — PREDNISONE 10 MG PO TABS: ORAL_TABLET | ORAL | 0 refills | Status: AC

## 2018-10-21 NOTE — Progress Notes (Signed)
Virtual Visit via Telephone Note  I connected with Ivan Day on 10/21/18 at 11:30 AM EDT by telephone and verified that I am speaking with the correct person using two identifiers.  Location: Patient: Home Provider: [facility] - [address], [place], [place], [place] [address]   I discussed the limitations, risks, security and privacy concerns of performing an evaluation and management service by telephone and the availability of in person appointments. I also discussed with the patient that there may be a patient responsible charge related to this service. The patient expressed understanding and agreed to proceed.  Patient consented to consult via telephone: Yes People present and their role in pt care: Pt    History of Present Illness: 78 year old never smoker followed in our office for mild intermittent asthma.  Patient was last seen on 05/31/2018.  Maintenance: None Smoking history: Never smoker Patient of Dr. Lake Bells  Chief complaint: Wheezing   78 year old male never smoker initially referred to our office in February/2020 for evaluation of asthma.  Patient has no maintenance inhaler.  Patient is reporting today 3 weeks of worsened wheezing.  Patient has an audible inspiratory and expiratory wheeze on telephone visit today.  Patient's daughter [name] is also on the phone reporting that the breathing is worsened.  Patient was seen by primary care last week and was given an albuterol inhaler.  Patient has been maintained on DuoNeb nebulized medication every 6 hours for the last week which has not improved the wheezing.  Patient does have a cough with white mucus he reports this is baseline.  Patient is afebrile.  Observations/Objective:  Audible insp and exp wheezes   10/21/2018-temperature-98 5  Chest imaging: 04/2018 CXR reviewed showing normal pulmonary parenchyma, no cardiac abnormality  PFT: November 2019 spirometry at doctors up in [place] showed  ratio of 68%, FEV1 of 2.14 L 70% predicted which improved to 2.21 L and a normal ratio 72% predicted after bronchodilator, some improvement in small airways  Exhaled NO 05/2018: 15 ppb  Assessment and Plan:  Asthma Assessment: Worsened breathing and wheezing for the last 3 weeks Using duo nebs every 6 hours for the last week Inspiratory and expiratory wheeze audibly heard on telephone visit today  Plan: Prednisone taper today Continue duo nebs every 6 hours for the next 3 days, then transition to duo nebs as needed every 6 hours for shortness of breath or wheezing Close follow-up in office in 2 to 4 weeks to further evaluate may need to consider PRN ICS/Laba  Wheezing Plan: Prednisone taper Duo nebs every 6 hours as needed Close follow-up with our office in 2 to 4 weeks   Follow Up Instructions:  Return in about 4 weeks (around 11/18/2018), or if symptoms worsen or fail to improve, for Follow up with [name].   I discussed the assessment and treatment plan with the patient. The patient was provided an opportunity to ask questions and all were answered. The patient agreed with the plan and demonstrated an understanding of the instructions.   The patient was advised to call back or seek an in-person evaluation if the symptoms worsen or if the condition fails to improve as anticipated.  I provided 23 minutes of non-face-to-face time during this encounter.   Lauraine Rinne, NP

## 2018-10-21 NOTE — Assessment & Plan Note (Signed)
Assessment: Worsened breathing and wheezing for the last 3 weeks Using duo nebs every 6 hours for the last week Inspiratory and expiratory wheeze audibly heard on telephone visit today  Plan: Prednisone taper today Continue duo nebs every 6 hours for the next 3 days, then transition to duo nebs as needed every 6 hours for shortness of breath or wheezing Close follow-up in office in 2 to 4 weeks to further evaluate may need to consider PRN ICS/Laba

## 2018-10-21 NOTE — Patient Instructions (Addendum)
Prednisone 10mg  tablet  >>>4 tabs for 3 days, then 3 tabs for 3 days, 2 tabs for 3 days, then 1 tab for 3 days, then stop >>>take with food  >>>take in the morning   Hold off on antibiotics at this time, contact our office if mucus color changes or fevers develop  We will hold off on community testing of SARS-CoV-2 as patient has been practicing social distancing and has not been out in the community over the last 3 to 4 weeks.  Continue DuoNeb nebulized medication every 6 hours for the next 3 days then can transition to using every 6 hours as needed  Follow-up with our office in 2 to 4 weeks - DTR [name] okay to come back   Return in about 4 weeks (around 11/18/2018), or if symptoms worsen or fail to improve, for Follow up with [name].  Coronavirus (COVID-19) Are you at risk?  Are you at risk for the Coronavirus (COVID-19)?  To be considered HIGH RISK for Coronavirus (COVID-19), you have to meet the following criteria:  . Traveled to [place], [place], [place], [place] or [place]; or in the [place] to [place], [place], [place], or [place]; and have fever, cough, and shortness of breath within the last 2 weeks of travel OR . Been in close contact with a person diagnosed with COVID-19 within the last 2 weeks and have fever, cough, and shortness of breath . IF YOU DO NOT MEET THESE CRITERIA, YOU ARE CONSIDERED LOW RISK FOR COVID-19.  What to do if you are HIGH RISK for COVID-19?  Marland Kitchen If you are having a medical emergency, call 911. . Seek medical care right away. Before you go to a doctor's office, urgent care or emergency department, call ahead and tell them about your recent travel, contact with someone diagnosed with COVID-19, and your symptoms. You should receive instructions from your physician's office regarding next steps of care.  . When you arrive at healthcare provider, tell the healthcare staff immediately you have returned from visiting [place], [place], [place],  [place] or [place]; or traveled in the [place] to [place], [place], [place], or [place]; in the last two weeks or you have been in close contact with a person diagnosed with COVID-19 in the last 2 weeks.   . Tell the health care staff about your symptoms: fever, cough and shortness of breath. . After you have been seen by a medical provider, you will be either: o Tested for (COVID-19) and discharged home on quarantine except to seek medical care if symptoms worsen, and asked to  - Stay home and avoid contact with others until you get your results (4-5 days)  - Avoid travel on public transportation if possible (such as bus, train, or airplane) or o Sent to the Emergency Department by EMS for evaluation, COVID-19 testing, and possible admission depending on your condition and test results.  What to do if you are LOW RISK for COVID-19?  Reduce your risk of any infection by using the same precautions used for avoiding the common cold or flu:  Marland Kitchen Wash your hands often with soap and warm water for at least 20 seconds.  If soap and water are not readily available, use an alcohol-based hand sanitizer with at least 60% alcohol.  . If coughing or sneezing, cover your mouth and nose by coughing or sneezing into the elbow areas of your shirt or coat, into a tissue or into your sleeve (  not your hands). . Avoid shaking hands with others and consider head nods or verbal greetings only. . Avoid touching your eyes, nose, or mouth with unwashed hands.  . Avoid close contact with people who are sick. . Avoid places or events with large numbers of people in one location, like concerts or sporting events. . Carefully consider travel plans you have or are making. . If you are planning any travel outside or inside the [place], visit the CDC's Travelers' Health webpage for the latest health notices. . If you have some symptoms but not all symptoms, continue to monitor at home and seek medical attention if  your symptoms worsen. . If you are having a medical emergency, call 911.   [place] / e-Visit: [email or web address]         [facility]: [facility]: [phone]                   [facility]: [phone]           It is flu season:   >>> Best ways to protect herself from the flu: Receive the yearly flu vaccine, practice good hand hygiene washing with soap and also using hand sanitizer when available, eat a nutritious meals, get adequate rest, hydrate appropriately   Please contact the office if your symptoms worsen or you have concerns that you are not improving.   Thank you for choosing [facility] for your healthcare, and for allowing [place] to partner with you on your healthcare journey. I am thankful to be able to provide care to you today.   [name]    Asthma Attack  Acute bronchospasm caused by asthma is also referred to as an asthma attack. Bronchospasm means that the air passages become narrowed or "tight," which limits the amount of oxygen that can get into the lungs. The narrowing is caused by inflammation and tightening of the muscles in the air tubes (bronchi) in the lungs. Excessive mucus is also produced, which narrows the airways more. This can cause trouble breathing, coughing, and loud breathing (wheezing). What are the causes? Possible triggers include:  Animal dander from the skin, hair, or feathers of animals.  Dust mites contained in house dust.  Cockroaches.  Pollen from trees or grass.  Mold.  Cigarette or tobacco smoke.  Air pollutants such as dust, household cleaners, hair sprays, aerosol sprays, paint fumes, strong chemicals, or strong odors.  Cold air or weather changes. Cold air may trigger inflammation. Winds increase molds and pollens in the air.  Strong emotions  such as crying or laughing hard.  Stress.  Certain medicines, such as aspirin or beta-blockers.  Sulfites in foods and drinks, such as dried fruits and wine.  Infections or inflammatory conditions, such as a flu, a cold, pneumonia, or inflammation of the nasal membranes (rhinitis).  Gastroesophageal reflux disease (GERD). GERD is a condition in which stomach acid backs up into your esophagus, which can irritate nearby airway structures.  Exercise or activity that requires a lot of energy. What are the signs or symptoms? Symptoms of this condition include:  Wheezing. This may sound like whistling while breathing. This may be more noticeable at night.  Excessive coughing, particularly at night.  Chest tightness or pain.  Shortness of breath.  Feeling like you cannot get enough air no matter how hard you try (air hunger). How is this diagnosed? This condition may  be diagnosed based on:  Your medical history.  Your symptoms.  A physical exam.  Tests to check for other causes of your symptoms or other conditions that may have triggered your asthma attack. These tests may include: ? Chest X-ray. ? Blood tests. ? Specialized tests to assess lung function, such as breathing into a device that measures how much air you inhale and exhale (spirometry). How is this treated? The goal of treatment is to open the airways in your lungs and reduce inflammation. Most asthma attacks are treated with medicines that you inhale through a hand-held inhaler (metered dose inhaler, MDI) or a device that turns liquid medicine into a mist that you inhale (nebulizer). Medicines may include:  Quick relief or rescue medicines that relax the muscles of the bronchi. These medicines include bronchodilators, such as albuterol.  Controller medicines, such as inhaled corticosteroids. These are long-acting medicines that are used for daily asthma maintenance. If you have a moderate or severe asthma attack, you  may be treated with steroid medicines by mouth or through an IV injection at the hospital. Steroid medicines reduce inflammation in your lungs. Depending on the severity of your attack, you may need oxygen therapy to help you breathe. If your asthma attack was caused by a bacterial infection, such as pneumonia, you will be given antibiotic medicines. Follow these instructions at home: Medicines  Take over-the-counter and prescription medicines only as told by your health care provider. Keep your medicines up-to-date and available.  If you are more than [redacted] weeks pregnant and you are prescribed any new medicines, tell your obstetrician about those medicines.  If you were prescribed an antibiotic medicine, take it as told by your health care provider. Do not stop taking the antibiotic even if you start to feel better. Avoiding triggers   Keep track of things that trigger your asthma attacks or cause you to have breathing problems, and avoid exposure to these triggers.  Do not use any products that contain nicotine or tobacco, such as cigarettes and e-cigarettes. If you need help quitting, ask your health care provider.  Avoid secondhand smoke.  Avoid strong smells, such as perfumes, aerosols, and cleaning solvents.  When pollen or air pollution is bad, keep windows closed and use an air conditioner or go to places with air conditioning. Asthma action plan  Work with your health care provider to make a written plan for managing and treating your asthma attacks (asthma action plan). This plan should include: ? A list of your asthma triggers and how to avoid them. ? Information about when your medicines should be taken and when their dosage should be changed. ? Instructions about using a device called a peak flow meter to monitor your condition. A peak flow meter measures how well your lungs are working and measures how severe your asthma is at a given time. Your "personal best" is the highest  peak flow rate you can reach when you feel good and have no asthma symptoms. General instructions  Avoid excessive exercise or activity until your asthma attack resolves. Ask your health care provider what activities are safe for you and when you can return to your normal activities.  Stay up to date on all vaccinations recommended by your health care provider, such as flu and pneumonia vaccines.  Drink enough fluid to keep your urine clear or pale yellow. Staying hydrated helps keep mucus in your lungs thin so it can be coughed up easily.  If you drink caffeine, do  so in moderation.  Do not use alcohol until you have recovered.  Keep all follow-up visits as told by your health care provider. This is important. Asthma requires careful medical care, and you and your health care provider can work together to reduce the likelihood of future attacks. Contact a health care provider if:  Your peak flow reading is still at 50-79% of your personal best after you have followed your action plan for 1 hour. This is in the yellow zone, which means "caution."  You need to use a reliever medicine more than 2-3 times a week.  Your medicines are causing side effects, such as: ? Rash. ? Itching. ? Swelling. ? Trouble breathing.  Your symptoms do not improve after 48 hours.  You cough up mucus (sputum) that is thicker than usual.  You have a fever.  You need to use your medicines much more frequently than normal. Get help right away if:  Your peak flow reading is less than 50% of your personal best. This is in the red zone, which means "danger."  You have severe trouble breathing.  You develop chest pain or discomfort.  Your medicines no longer seem to be helping.  You vomit.  You cannot eat or drink without vomiting.  You are coughing up yellow, green, brown, or bloody mucus.  You have a fever and your symptoms suddenly get worse.  You have trouble swallowing.  You feel very  tired, and breathing becomes tiring. Summary  Acute bronchospasm caused by asthma is also referred to as an asthma attack.  Bronchospasm is caused by narrowing or tightness in air passages, which causes shortness of breath, coughing, and loud breathing (wheezing).  Many things can trigger an asthma attack, such as allergens, weather changes, exercise, smoke, and other fumes.  Treatment for an asthma attack may include inhaled rescue medicines for immediate relief, as well as the use of maintenance therapy.  Get help right away if you have worsening shortness of breath, chest pain, or fever, or if your home medicines are no longer helping with your symptoms. This information is not intended to replace advice given to you by your health care provider. Make sure you discuss any questions you have with your health care provider. Document Released: 07/05/2006 Document Revised: 07/09/2018 Document Reviewed: 04/21/2016 Elsevier Patient Education  2020 [employer].

## 2018-10-21 NOTE — Telephone Encounter (Signed)
Primary Pulmonologist: Dr. Lake Bells  Last office visit and with whom: 05/31/2018 with McQuaid  What do we see them for (pulmonary problems): Asthma   Reason for call: Increased coughing, wheezing, shortness of breath, using duoneb 4 times a day  In the last month, have you been in contact with someone who was confirmed or suspected to have Con[place]avirus / COVID-19? no  Do you have any of the following symptoms developed in the last 30 days? Fever: no Cough: yes Shortness of breath: yes  When did your symptoms start?  5 days ago  If the patient has a fever, what is the last reading?  (use n/a if patient denies fever)  n/a . IF THE PATIENT STATES THEY DO NOT OWN A THERMOMETER, THEY MUST GO AND PURCHASE ONE When did the fever start?: n/a Have you taken any medication to suppress a fever (ie Ibuprofen, Aleve, Tylenol)?: n/a   Scheduled patient with telephone visit with [name], NP for today at 1130.  Nothing further needed at this time.

## 2018-10-21 NOTE — Assessment & Plan Note (Signed)
Plan: Prednisone taper Duo nebs every 6 hours as needed Close follow-up with our office in 2 to 4 weeks

## 2018-10-24 NOTE — Progress Notes (Signed)
Reviewed, agree 

## 2018-10-28 ENCOUNTER — Telehealth: Payer: Self-pay | Admitting: Pulmonary Disease

## 2018-10-28 DIAGNOSIS — R062 Wheezing: Secondary | ICD-10-CM

## 2018-10-28 MED ORDER — DOXYCYCLINE HYCLATE 100 MG PO TABS: 100.0000 mg | ORAL_TABLET | Freq: Two times a day (BID) | ORAL | 0 refills | Status: DC

## 2018-10-28 NOTE — Telephone Encounter (Signed)
Called and spoke with pt's daughter [name] letting her know that Aaron Edelman said to send in abx doxy to pharmacy for pt but also stated to her that he did want pt to be tested for covid as well. [name] verbalized understanding. I gave [name] all three of the test sites where pt could go to be tested for covid and stated to her that pt did not need to have an appt but prior to upcoming OV 8/10 we would need to have the results making sure that he is negative for covid.  Verified pt's preferred pharmacy and sent Rx in for pt. Also placed order for covid test.nothing further needed.

## 2018-10-28 NOTE — Telephone Encounter (Signed)
Primary Pulmonologist: BQ Last office visit and with whom: 10/21/2018 with Ivan Day What do we see them for (pulmonary problems): asthma Last OV assessment/plan: Instructions    Return in about 4 weeks (around 11/18/2018), or if symptoms worsen or fail to improve, for Follow up with Ivan Quaker FNP-C. Prednisone 10mg  tablet  >>>4 tabs for 3 days, then 3 tabs for 3 days, 2 tabs for 3 days, then 1 tab for 3 days, then stop >>>take with food  >>>take in the morning   Hold off on antibiotics at this time, contact our office if mucus color changes or fevers develop  We will hold off on community testing of SARS-CoV-2 as patient has been practicing social distancing and has not been out in the community over the last 3 to 4 weeks.  Continue DuoNeb nebulized medication every 6 hours for the next 3 days then can transition to using every 6 hours as needed  Follow-up with our office in 2 to 4 weeks - DTR Ivan Day okay to come back   Return in about 4 weeks (around 11/18/2018), or if symptoms worsen or fail to improve, for Follow up with Ivan Quaker FNP-C.     *Pt scheduled for OV with Brian 8/10*  Was appointment offered to patient (explain)?  Pt and daughter Ivan Day want recommendations   Reason for call: Called and spoke with pt's daughter Ivan Day who stated pt is still currently taking prednisone and is also doing duoneb treatments but pt is still wheezing. With the prednisone titration, pt is now taking 20mg . Ivan Day stated that pt is coughing and phlegm is thick and white in color.  Ivan Day stated that she was told by her mom that pt coughs throughout the night and when the cough gets bad enough, pt will have to go to the recliner to sleep having himself elevated to see if that could help relieve the cough.  Pt has also had some tightness in chest and when he has the tightness, pt does a breathing treatment which will help with his symptoms. Pt has used the rescue inhaler at least once but it does  not help as well as the neb treatments. Pt will do between 3-4 neb treatments throughout the day.  Pt has not had any fever and has not had to be tested for COVID.   Ivan Day stated that last televisit 7/20, Ivan Day mentioned abx but at the time abx was on hold unless pt had developed a fever or had any change in mucus but Ivan Day is now wondering if it might help if pt is prescribed an abx.  Ivan Day, please advise on this for pt and daughter Ivan Day. Thanks!

## 2018-10-28 NOTE — Telephone Encounter (Signed)
Sorry to hear the patient is not feeling well.  Can offer:  Doxycycline >>> 1 100 mg tablet every 12 hours for 10 days >>>take with food  >>>wear sunscreen   Please place order.   Please also place the order for [id number] for COVID testing.  Place order as normal and lab collect.  Appointments are not required and please instruct the patient to present to 1 of these testing sites listed below whichever is closest and most convenient for the patient.  COVID Testing Site Locations (For sick patients only, pre-procedure is done differently)  . [facility] [address] [place], [place] [address] . [place]  (on [employer])  . [address], [employer] (across from [facility] Department)    Ideally we will have these results prior to patient scheduled office visit on 11/11/2018.  So that way this office visit can be in person to allow [place] to physically assess the patient ensure symptoms are improving.  Can also consider chest x-ray at that appointment.  Thank the patient for contacting our office regarding his worsening symptoms.  Please encourage them that if symptoms or not improving to contact our office or present to an emergency room for further evaluation.  [name] FNP

## 2018-11-06 ENCOUNTER — Other Ambulatory Visit: Payer: Self-pay

## 2018-11-06 DIAGNOSIS — Z20822 Contact with and (suspected) exposure to covid-19: Secondary | ICD-10-CM

## 2018-11-08 LAB — NOVEL CORONAVIRUS, NAA: SARS-CoV-2, NAA: NOT DETECTED

## 2018-11-10 DIAGNOSIS — J45909 Unspecified asthma, uncomplicated: Secondary | ICD-10-CM | POA: Diagnosis not present

## 2018-11-10 NOTE — Progress Notes (Signed)
@Patient  ID: Ivan Day, male    DOB: 01-21-1941, 78 y.o.   MRN: [id number]  Chief Complaint  Patient presents with   Follow-up    Asthma     Referring provider: Nicoletta Dress, MD  HPI:  78 year old never smoker followed in our office for mild intermittent asthma.    Maintenance: None Smoking history: Never smoker Patient of Dr. Lake Bells  11/11/2018  - Visit   78 year old male never smoker followed in our office for asthma.  Patient was last treated telephonically 10/21/2018 for suspected asthma exacerbation.  Patient felt that after he completed the prednisone doxycycline he was doing better but then felt more labored breathing at night.  Feels a little tight chested and wheezy at times today..  Patient is not having much of a cough.  If he does have a cough it is nonproductive.  Patient reports that he has been audibly wheezing for the last week according to his family.  He has never been managed on a maintenance inhaler.  He does have albuterol rescue Lovena Le as well as albuterol nebulized meds at home.  He does not currently take an antihistamine.  He reports he did have childhood asthma.   Tests:   Chest imaging: 04/2018 reviewed showing normal pulmonary parenchyma, no cardiac abnormality    PFT: November 2019 spirometry at doctors up in [place] showed ratio of 68%, FEV1 of 2.14 L 70% predicted which improved to 2.21 L and a normal ratio 72% predicted after bronchodilator, some improvement in small airways  Exhaled NO 05/2018: 15 ppb  FENO:  No results found for: NITRICOXIDE  PFT: No flowsheet data found.  Imaging: No results found.    Specialty Problems      Pulmonary Problems   Asthma    Chest imaging: 04/2018 CXR reviewed showing normal pulmonary parenchyma, no cardiac abnormality  PFT: November 2019 spirometry at doctors up in [place] showed ratio of 68%, FEV1 of 2.14 L 70% predicted which improved to 2.21 L and a normal ratio 72% predicted  after bronchodilator, some improvement in small airways  Exhaled NO 05/2018: 15 ppb      Wheezing      Allergies  Allergen Reactions   Naproxen Hives    Immunization History  Administered Date(s) Administered   Influenza-Unspecified 01/28/2018    Past Medical History:  Diagnosis Date   Asthma    COPD (chronic obstructive pulmonary disease) (HCC)    Prostate cancer ([place])     Tobacco History: Social History   Tobacco Use  Smoking Status Never Smoker  Smokeless Tobacco Never Used   Counseling given: Not Answered   Continue to not smoke  Outpatient Encounter Medications as of 11/11/2018  Medication Sig   albuterol (VENTOLIN HFA) 108 (90 Base) MCG/ACT inhaler Inhale into the lungs every 6 (six) hours as needed for wheezing or shortness of breath.   co-enzyme Q-10 30 MG capsule Take 100 mg by mouth daily.   ipratropium-albuterol (DUONEB) 0.5-2.5 (3) MG/3ML SOLN Take 3 mLs by nebulization every 6 (six) hours as needed.   Multiple Vitamin (MULTIVITAMIN) tablet Take 1 tablet by mouth daily.   Omega-3 Fatty Acids (FISH OIL) 1200 MG CAPS Take 1,200 mg by mouth 2 (two) times daily.   [DISCONTINUED] doxycycline (VIBRA-TABS) 100 MG tablet Take 1 tablet (100 mg total) by mouth 2 (two) times daily.   fluticasone furoate-vilanterol (BREO ELLIPTA) 200-25 MCG/INH AEPB Inhale 1 puff into the lungs daily.   predniSONE (DELTASONE) 10 MG tablet  4 tabs for 2 days, then 3 tabs for 2 days, 2 tabs for 2 days, then 1 tab for 2 days, then stop   No facility-administered encounter medications on file as of 11/11/2018.      Review of Systems  Review of Systems  Constitutional: Positive for fatigue. Negative for activity change, chills, fever and unexpected weight change.  HENT: Negative for postnasal drip, rhinorrhea, sinus pressure, sinus pain and sore throat.   Eyes: Negative.   Respiratory: Positive for cough, chest tightness, shortness of breath and wheezing.     Cardiovascular: Negative for chest pain and palpitations.  Gastrointestinal: Negative for constipation, diarrhea, nausea and vomiting.  Endocrine: Negative.   Genitourinary: Negative.   Musculoskeletal: Negative.   Skin: Negative.   Neurological: Negative for dizziness and headaches.  Psychiatric/Behavioral: Negative.  Negative for dysphoric mood. The patient is not nervous/anxious.   All other systems reviewed and are negative.    Physical Exam  BP (!) 164/76 (BP Location: Right Arm, Patient Position: Sitting, Cuff Size: Normal)    Pulse 77    Temp 98.1 F (36.7 C)    Ht 5\' 10"  (1.778 m)    Wt 173 lb (78.5 kg)    SpO2 95%    BMI 24.82 kg/m   Wt Readings from Last 5 Encounters:  11/11/18 173 lb (78.5 kg)  05/31/18 170 lb 12.8 oz (77.5 kg)     Physical Exam Vitals signs and nursing note reviewed.  Constitutional:      General: He is not in acute distress.    Appearance: Normal appearance.  HENT:     Head: Normocephalic and atraumatic.     Right Ear: Hearing, tympanic membrane, ear canal and external ear normal.     Left Ear: Hearing, tympanic membrane, ear canal and external ear normal.     Nose: Mucosal edema, congestion and rhinorrhea present.     Mouth/Throat:     Mouth: Mucous membranes are dry.     Pharynx: Oropharynx is clear. No oropharyngeal exudate.     Comments: Postnasal drip Eyes:     Pupils: Pupils are equal, round, and reactive to light.  Neck:     Musculoskeletal: Normal range of motion.  Cardiovascular:     Rate and Rhythm: Normal rate and regular rhythm.     Pulses: Normal pulses.     Heart sounds: Normal heart sounds. No murmur.  Pulmonary:     Effort: Pulmonary effort is normal.     Breath sounds: Wheezing (Inspiratory and expiratory wheezes) present. No decreased breath sounds or rales.  Abdominal:     General: Bowel sounds are normal. There is no distension.     Palpations: Abdomen is soft.     Tenderness: There is no abdominal tenderness.   Musculoskeletal:     Right lower leg: No edema.     Left lower leg: No edema.  Lymphadenopathy:     Cervical: No cervical adenopathy.  Skin:    General: Skin is warm and dry.     Capillary Refill: Capillary refill takes less than 2 seconds.     Findings: No erythema or rash.  Neurological:     General: No focal deficit present.     Mental Status: He is alert and oriented to person, place, and time.     Motor: No weakness.     Coordination: Coordination normal.     Gait: Gait is intact. Gait normal.  Psychiatric:        Mood and Affect:  Mood normal.        Behavior: Behavior normal. Behavior is cooperative.        Thought Content: Thought content normal.        Judgment: Judgment normal.      Lab Results:  CBC No results found for: WBC, RBC, HGB, HCT, PLT, MCV, MCH, MCHC, RDW, LYMPHSABS, MONOABS, EOSABS, BASOSABS  BMET No results found for: NA, K, CL, CO2, GLUCOSE, BUN, CREATININE, CALCIUM, GFRNONAA, GFRAA  BNP No results found for: BNP  ProBNP No results found for: PROBNP    Assessment & Plan:   Asthma Likely continued exacerbation of asthma Patient not on any maintenance inhalers Inspiratory and expiratory wheezes today  Plan: Chest x-ray today Lab work today Prednisone taper today Start Breo Ellipta 200 trial Can use rescue inhaler or nebulized meds every 6 hours as needed for shortness of breath or wheezing Start daily antihistamine 4-week follow-up with our office with pulmonary function test to establish with Dr. Carlis Abbott    Return in about 4 weeks (around 12/09/2018), or if symptoms worsen or fail to improve, for Follow up with Dr. Carlis Abbott, Follow up for PFT.   Lauraine Rinne, NP 11/11/2018   This appointment was 28 minutes long with over 50% of the time in direct face-to-face patient care, assessment, plan of care, and follow-up.

## 2018-11-11 ENCOUNTER — Ambulatory Visit (INDEPENDENT_AMBULATORY_CARE_PROVIDER_SITE_OTHER): Payer: Medicare Other

## 2018-11-11 ENCOUNTER — Other Ambulatory Visit: Payer: Self-pay

## 2018-11-11 ENCOUNTER — Ambulatory Visit: Payer: Medicare Other | Admitting: Pulmonary Disease

## 2018-11-11 ENCOUNTER — Encounter: Payer: Self-pay | Admitting: Pulmonary Disease

## 2018-11-11 VITALS — BP 164/76 | HR 77 | Temp 98.1°F | Ht 70.0 in | Wt 173.0 lb

## 2018-11-11 DIAGNOSIS — J4520 Mild intermittent asthma, uncomplicated: Secondary | ICD-10-CM | POA: Diagnosis not present

## 2018-11-11 DIAGNOSIS — R062 Wheezing: Secondary | ICD-10-CM | POA: Diagnosis not present

## 2018-11-11 LAB — CBC WITH DIFFERENTIAL/PLATELET
Basophils Absolute: 0.2 10*3/uL — ABNORMAL HIGH (ref 0.0–0.1)
Basophils Relative: 2.4 % (ref 0.0–3.0)
Eosinophils Absolute: 0.6 10*3/uL (ref 0.0–0.7)
Eosinophils Relative: 7.4 % — ABNORMAL HIGH (ref 0.0–5.0)
HCT: 43.5 % (ref 39.0–52.0)
Hemoglobin: 14.7 g/dL (ref 13.0–17.0)
Lymphocytes Relative: 29.7 % (ref 12.0–46.0)
Lymphs Abs: 2.5 10*3/uL (ref 0.7–4.0)
MCHC: 33.7 g/dL (ref 30.0–36.0)
MCV: 91.6 fl (ref 78.0–100.0)
Monocytes Absolute: 0.5 10*3/uL (ref 0.1–1.0)
Monocytes Relative: 5.9 % (ref 3.0–12.0)
Neutro Abs: 4.6 10*3/uL (ref 1.4–7.7)
Neutrophils Relative %: 54.6 % (ref 43.0–77.0)
Platelets: 264 10*3/uL (ref 150.0–400.0)
RBC: 4.75 Mil/uL (ref 4.22–5.81)
RDW: 15.2 % (ref 11.5–15.5)
WBC: 8.4 10*3/uL (ref 4.0–10.5)

## 2018-11-11 MED ORDER — BREO ELLIPTA 200-25 MCG/INH IN AEPB: 1.0000 | INHALATION_SPRAY | Freq: Every day | RESPIRATORY_TRACT | 0 refills | Status: DC

## 2018-11-11 MED ORDER — PREDNISONE 10 MG PO TABS: ORAL_TABLET | ORAL | 0 refills | Status: DC

## 2018-11-11 NOTE — Addendum Note (Signed)
Addended by: Suzzanne Cloud E on: 11/11/2018 03:15 PM   Modules accepted: Orders

## 2018-11-11 NOTE — Assessment & Plan Note (Addendum)
Likely continued exacerbation of asthma Patient not on any maintenance inhalers Inspiratory and expiratory wheezes today  Plan: Chest x-ray today Lab work today Prednisone taper today Start Breo Ellipta 200 trial Can use rescue inhaler or nebulized meds every 6 hours as needed for shortness of breath or wheezing Start daily antihistamine 4-week follow-up with our office with pulmonary function test to establish with Dr. Carlis Abbott

## 2018-11-11 NOTE — Patient Instructions (Addendum)
Lab work today   Prednisone 10mg  tablet  >>>4 tabs for 2 days, then 3 tabs for 2 days, 2 tabs for 2 days, then 1 tab for 2 days, then stop >>>take with food  >>>take in the morning   Chest Xray today   Breo Ellipta 200 >>> Take 1 puff daily in the morning right when you wake up >>>Rinse your mouth out after use >>>This is a daily maintenance inhaler, NOT a rescue inhaler >>>Contact our office if you are having difficulties affording or obtaining this medication >>>It is important for you to be able to take this daily and not miss any doses  Only use your albuterol as a rescue medication to be used if you can't catch your breath by resting or doing a relaxed purse lip breathing pattern.  - The less you use it, the better it will work when you need it. - Ok to use up to 2 puffs  every 4 hours if you must but call for immediate appointment if use goes up over your usual need - Don't leave home without it !!  (think of it like the spare tire for your car)   Please start taking a daily antihistamine:  >>>choose one of: zyrtec, claritin, allegra, or xyzal  >>>these are over the counter medications  >>>can choose generic option  >>>take daily  >>>this medication helps with allergies, post nasal drip, and cough     Return in about 4 weeks (around 12/09/2018), or if symptoms worsen or fail to improve, for Follow up with Dr. Carlis Abbott, Follow up for PFT.   Coronavirus (COVID-19) Are you at risk?  Are you at risk for the Coronavirus (COVID-19)?  To be considered HIGH RISK for Coronavirus (COVID-19), you have to meet the following criteria:  . Traveled to [place], [place], [place], [place] or [place]; or in the [place] to [place], [place], [place], or [place]; and have fever, cough, and shortness of breath within the last 2 weeks of travel OR . Been in close contact with a person diagnosed with COVID-19 within the last 2 weeks and have fever, cough, and shortness of breath . IF YOU  DO NOT MEET THESE CRITERIA, YOU ARE CONSIDERED LOW RISK FOR COVID-19.  What to do if you are HIGH RISK for COVID-19?  Marland Kitchen If you are having a medical emergency, call 911. . Seek medical care right away. Before you go to a doctor's office, urgent care or emergency department, call ahead and tell them about your recent travel, contact with someone diagnosed with COVID-19, and your symptoms. You should receive instructions from your physician's office regarding next steps of care.  . When you arrive at healthcare provider, tell the healthcare staff immediately you have returned from visiting [place], [place], [place], [place] or [place]; or traveled in the [place] to [place], [place], [place], or [place]; in the last two weeks or you have been in close contact with a person diagnosed with COVID-19 in the last 2 weeks.   . Tell the health care staff about your symptoms: fever, cough and shortness of breath. . After you have been seen by a medical provider, you will be either: o Tested for (COVID-19) and discharged home on quarantine except to seek medical care if symptoms worsen, and asked to  - Stay home and avoid contact with others until you get your results (4-5 days)  - Avoid travel on public transportation if possible (such as bus, train,  or airplane) or o Sent to the Emergency Department by EMS for evaluation, COVID-19 testing, and possible admission depending on your condition and test results.  What to do if you are LOW RISK for COVID-19?  Reduce your risk of any infection by using the same precautions used for avoiding the common cold or flu:  Marland Kitchen Wash your hands often with soap and warm water for at least 20 seconds.  If soap and water are not readily available, use an alcohol-based hand sanitizer with at least 60% alcohol.  . If coughing or sneezing, cover your mouth and nose by coughing or sneezing into the elbow areas of your shirt or coat, into a tissue or into your sleeve (not  your hands). . Avoid shaking hands with others and consider head nods or verbal greetings only. . Avoid touching your eyes, nose, or mouth with unwashed hands.  . Avoid close contact with people who are sick. . Avoid places or events with large numbers of people in one location, like concerts or sporting events. . Carefully consider travel plans you have or are making. . If you are planning any travel outside or inside the [place], visit the CDC's Travelers' Health webpage for the latest health notices. . If you have some symptoms but not all symptoms, continue to monitor at home and seek medical attention if your symptoms worsen. . If you are having a medical emergency, call 911.   [place] / e-Visit: [email or web address]         [facility]: [facility]: [phone]                   [facility]: [phone]           It is flu season:   >>> Best ways to protect herself from the flu: Receive the yearly flu vaccine, practice good hand hygiene washing with soap and also using hand sanitizer when available, eat a nutritious meals, get adequate rest, hydrate appropriately   Please contact the office if your symptoms worsen or you have concerns that you are not improving.   Thank you for choosing [facility] for your healthcare, and for allowing [place] to partner with you on your healthcare journey. I am thankful to be able to provide care to you today.   [name]     Asthma, Adult  Asthma is a long-term (chronic) condition in which the airways get tight and narrow. The airways are the breathing passages that lead from the nose and mouth down into the lungs. A person with asthma will have times when symptoms get worse. These are called asthma attacks. They can cause coughing, whistling sounds when you breathe  (wheezing), shortness of breath, and chest pain. They can make it hard to breathe. There is no cure for asthma, but medicines and lifestyle changes can help control it. There are many things that can bring on an asthma attack or make asthma symptoms worse (triggers). Common triggers include:  Mold.  Dust.  Cigarette smoke.  Cockroaches.  Things that can cause allergy symptoms (allergens). These include animal skin flakes (dander) and pollen from trees or grass.  Things that pollute the air. These may include household cleaners, wood smoke, smog, or chemical odors.  Cold air, weather changes, and wind.  Crying or laughing hard.  Stress.  Certain medicines or drugs.  Certain foods such as dried fruit, potato  chips, and grape juice.  Infections, such as a cold or the flu.  Certain medical conditions or diseases.  Exercise or tiring activities. Asthma may be treated with medicines and by staying away from the things that cause asthma attacks. Types of medicines may include:  Controller medicines. These help prevent asthma symptoms. They are usually taken every day.  Fast-acting reliever or rescue medicines. These quickly relieve asthma symptoms. They are used as needed and provide short-term relief.  Allergy medicines if your attacks are brought on by allergens.  Medicines to help control the body's defense (immune) system. Follow these instructions at home: Avoiding triggers in your home  Change your heating and air conditioning filter often.  Limit your use of fireplaces and wood stoves.  Get rid of pests (such as roaches and mice) and their droppings.  Throw away plants if you see mold on them.  Clean your floors. Dust regularly. Use cleaning products that do not smell.  Have someone vacuum when you are not home. Use a vacuum cleaner with a HEPA filter if possible.  Replace carpet with wood, tile, or vinyl flooring. Carpet can trap animal skin flakes and  dust.  Use allergy-proof pillows, mattress covers, and box spring covers.  Wash bed sheets and blankets every week in hot water. Dry them in a dryer.  Keep your bedroom free of any triggers.  Avoid pets and keep windows closed when things that cause allergy symptoms are in the air.  Use blankets that are made of polyester or cotton.  Clean bathrooms and kitchens with bleach. If possible, have someone repaint the walls in these rooms with mold-resistant paint. Keep out of the rooms that are being cleaned and painted.  Wash your hands often with soap and water. If soap and water are not available, use hand sanitizer.  Do not allow anyone to smoke in your home. General instructions  Take over-the-counter and prescription medicines only as told by your doctor. ? Talk with your doctor if you have questions about how or when to take your medicines. ? Make note if you need to use your medicines more often than usual.  Do not use any products that contain nicotine or tobacco, such as cigarettes and e-cigarettes. If you need help quitting, ask your doctor.  Stay away from secondhand smoke.  Avoid doing things outdoors when allergen counts are high and when air quality is low.  Wear a ski mask when doing outdoor activities in the winter. The mask should cover your nose and mouth. Exercise indoors on cold days if you can.  Warm up before you exercise. Take time to cool down after exercise.  Use a peak flow meter as told by your doctor. A peak flow meter is a tool that measures how well the lungs are working.  Keep track of the peak flow meter's readings. Write them down.  Follow your asthma action plan. This is a written plan for taking care of your asthma and treating your attacks.  Make sure you get all the shots (vaccines) that your doctor recommends. Ask your doctor about a flu shot and a pneumonia shot.  Keep all follow-up visits as told by your doctor. This is important. Contact  a doctor if:  You have wheezing, shortness of breath, or a cough even while taking medicine to prevent attacks.  The mucus you cough up (sputum) is thicker than usual.  The mucus you cough up changes from clear or white to yellow, green, gray, or  bloody.  You have problems from the medicine you are taking, such as: ? A rash. ? Itching. ? Swelling. ? Trouble breathing.  You need reliever medicines more than 2-3 times a week.  Your peak flow reading is still at 50-79% of your personal best after following the action plan for 1 hour.  You have a fever. Get help right away if:  You seem to be worse and are not responding to medicine during an asthma attack.  You are short of breath even at rest.  You get short of breath when doing very little activity.  You have trouble eating, drinking, or talking.  You have chest pain or tightness.  You have a fast heartbeat.  Your lips or fingernails start to turn blue.  You are light-headed or dizzy, or you faint.  Your peak flow is less than 50% of your personal best.  You feel too tired to breathe normally. Summary  Asthma is a long-term (chronic) condition in which the airways get tight and narrow. An asthma attack can make it hard to breathe.  Asthma cannot be cured, but medicines and lifestyle changes can help control it.  Make sure you understand how to avoid triggers and how and when to use your medicines. This information is not intended to replace advice given to you by your health care provider. Make sure you discuss any questions you have with your health care provider. Document Released: 09/06/2007 Document Revised: 05/23/2018 Document Reviewed: 04/24/2016 Elsevier Patient Education  2020 [employer].

## 2018-11-12 ENCOUNTER — Telehealth: Payer: Self-pay | Admitting: Pulmonary Disease

## 2018-11-12 LAB — IGE: IgE (Immunoglobulin E), Serum: 271 kU/L — ABNORMAL HIGH (ref <=114)

## 2018-11-12 MED ORDER — FEXOFENADINE HCL 180 MG PO TABS: 180.0000 mg | ORAL_TABLET | Freq: Every day | ORAL | 4 refills | Status: DC

## 2018-11-12 NOTE — Progress Notes (Signed)
Blood work showing elevated allergy markers.  This is not shocking given your work-up.  Continue with the inhaler as outlined.  Continue forward with planned pulmonary function testing in 4-week follow-up with Dr. Carlis Abbott.  [name], FNP

## 2018-11-12 NOTE — Telephone Encounter (Signed)
Patient daughter is returning the call. CB [phone]

## 2018-11-12 NOTE — Telephone Encounter (Signed)
Called and spoke with pt's daughter [name]. [name] was asking if 90-day supply Rx for Allegra could be called in to pharmacy for pt as it would be cheaper for pt to get the Rx vs getting the med OTC. Checked with Aaron Edelman and Aaron Edelman said it would be okay for the Rx to be sent in to pharmacy. I verified pharmacy with [name] and med has been sent in.   [name] also requested that she be the one to be called when it is time to get pt scheduled for PFT with OV after with Dr. Carlis Abbott. Routing to [employer] as an FYI so she knows to contact pt's daughter [name] to get pt scheduled for the PFT as she will be the one who will be bringing pt.

## 2018-11-12 NOTE — Progress Notes (Signed)
Your chest x-ray results of come back.  Showing no acute changes.  No plan of care changes at this time.  Keep follow-up appointment.    Follow-up with our office if symptoms worsen or you do not feel like you are improving under her current regimen.  It was a pleasure taking care of you,  Leighann Amadon, FNP 

## 2018-11-15 NOTE — Telephone Encounter (Signed)
Will await response from Patrice 

## 2018-11-16 NOTE — Progress Notes (Signed)
Reviewed, agree 

## 2018-11-20 NOTE — Telephone Encounter (Signed)
Patrice - please advise. Thanks.

## 2018-11-28 NOTE — Telephone Encounter (Signed)
Still waiting for pt to be scheduled for a PFT.

## 2018-12-05 ENCOUNTER — Telehealth: Payer: Self-pay | Admitting: Pulmonary Disease

## 2018-12-05 MED ORDER — BREO ELLIPTA 200-25 MCG/INH IN AEPB: 1.0000 | INHALATION_SPRAY | Freq: Every day | RESPIRATORY_TRACT | 3 refills | Status: DC

## 2018-12-05 NOTE — Telephone Encounter (Signed)
No PFTor f/up OV appt scheduled as of yet.  Due around Sept 10th according to note.  Ivan Day has been routed the message and will close this encounter since info has been sent.

## 2018-12-05 NOTE — Telephone Encounter (Signed)
Called [name] to advise her that Rx was sent to the pharmacy. There was no answer, LM to call back.

## 2018-12-06 NOTE — Telephone Encounter (Signed)
Spoke with pt's daughter, [name]. She is aware that this prescription was sent in. She picked this up yesterday from the pharmacy. Nothing further was needed at this time.

## 2018-12-11 DIAGNOSIS — J45909 Unspecified asthma, uncomplicated: Secondary | ICD-10-CM | POA: Diagnosis not present

## 2018-12-20 DIAGNOSIS — D225 Melanocytic nevi of trunk: Secondary | ICD-10-CM | POA: Diagnosis not present

## 2018-12-20 DIAGNOSIS — L570 Actinic keratosis: Secondary | ICD-10-CM | POA: Diagnosis not present

## 2018-12-20 DIAGNOSIS — L821 Other seborrheic keratosis: Secondary | ICD-10-CM | POA: Diagnosis not present

## 2018-12-20 DIAGNOSIS — L820 Inflamed seborrheic keratosis: Secondary | ICD-10-CM | POA: Diagnosis not present

## 2018-12-20 DIAGNOSIS — D2262 Melanocytic nevi of left upper limb, including shoulder: Secondary | ICD-10-CM | POA: Diagnosis not present

## 2018-12-23 NOTE — Telephone Encounter (Signed)
Noted. Patrice aware PFT needs scheduling.  

## 2018-12-30 NOTE — Telephone Encounter (Signed)
Call returned to Marvel Plan (dpr), pft and ov scheduled for 02/05/2019 with OV. Voiced understanding. Aware someone will contact her for covid scheduling prior to appt. Nothing further needed at this time.

## 2019-01-07 DIAGNOSIS — N138 Other obstructive and reflux uropathy: Secondary | ICD-10-CM | POA: Diagnosis not present

## 2019-01-10 DIAGNOSIS — J45909 Unspecified asthma, uncomplicated: Secondary | ICD-10-CM | POA: Diagnosis not present

## 2019-01-14 DIAGNOSIS — N200 Calculus of kidney: Secondary | ICD-10-CM | POA: Diagnosis not present

## 2019-01-14 DIAGNOSIS — Z87442 Personal history of urinary calculi: Secondary | ICD-10-CM | POA: Diagnosis not present

## 2019-01-24 DIAGNOSIS — M1711 Unilateral primary osteoarthritis, right knee: Secondary | ICD-10-CM | POA: Diagnosis not present

## 2019-01-24 DIAGNOSIS — M25561 Pain in right knee: Secondary | ICD-10-CM | POA: Diagnosis not present

## 2019-02-01 ENCOUNTER — Other Ambulatory Visit (HOSPITAL_COMMUNITY)
Admission: RE | Admit: 2019-02-01 | Discharge: 2019-02-01 | Disposition: A | Discharge status: Home / Self Care | Payer: Medicare Other | Source: Ambulatory Visit | Attending: Pulmonary Disease | Admitting: Pulmonary Disease

## 2019-02-01 DIAGNOSIS — Z01812 Encounter for preprocedural laboratory examination: Secondary | ICD-10-CM | POA: Insufficient documentation

## 2019-02-01 DIAGNOSIS — Z20828 Contact with and (suspected) exposure to other viral communicable diseases: Secondary | ICD-10-CM | POA: Diagnosis not present

## 2019-02-02 LAB — NOVEL CORONAVIRUS, NAA (HOSP ORDER, SEND-OUT TO REF LAB; TAT 18-24 HRS): SARS-CoV-2, NAA: NOT DETECTED

## 2019-02-02 NOTE — Progress Notes (Signed)
Negative covid test. This is good news.   [name] FNP

## 2019-02-05 ENCOUNTER — Encounter: Payer: Self-pay | Admitting: Critical Care Medicine

## 2019-02-05 ENCOUNTER — Ambulatory Visit: Payer: Medicare Other | Admitting: Critical Care Medicine

## 2019-02-05 ENCOUNTER — Ambulatory Visit (INDEPENDENT_AMBULATORY_CARE_PROVIDER_SITE_OTHER): Payer: Medicare Other | Admitting: Critical Care Medicine

## 2019-02-05 ENCOUNTER — Other Ambulatory Visit: Payer: Self-pay

## 2019-02-05 VITALS — BP 156/72 | HR 85 | Temp 98.1°F | Ht 69.0 in | Wt 176.0 lb

## 2019-02-05 DIAGNOSIS — J4540 Moderate persistent asthma, uncomplicated: Secondary | ICD-10-CM

## 2019-02-05 DIAGNOSIS — J309 Allergic rhinitis, unspecified: Secondary | ICD-10-CM

## 2019-02-05 DIAGNOSIS — J4520 Mild intermittent asthma, uncomplicated: Secondary | ICD-10-CM | POA: Diagnosis not present

## 2019-02-05 LAB — PULMONARY FUNCTION TEST
DL/VA % pred: 119 %
DL/VA: 4.76 ml/min/mmHg/L
DLCO unc % pred: 116 %
DLCO unc: 28.23 ml/min/mmHg
FEF 25-75 Post: 1.88 L/sec
FEF 25-75 Pre: 1.4 L/sec
FEF2575-%Change-Post: 34 %
FEF2575-%Pred-Post: 90 %
FEF2575-%Pred-Pre: 67 %
FEV1-%Change-Post: 7 %
FEV1-%Pred-Post: 92 %
FEV1-%Pred-Pre: 86 %
FEV1-Post: 2.69 L
FEV1-Pre: 2.5 L
FEV1FVC-%Change-Post: 7 %
FEV1FVC-%Pred-Pre: 93 %
FEV6-%Change-Post: 0 %
FEV6-%Pred-Post: 97 %
FEV6-%Pred-Pre: 96 %
FEV6-Post: 3.66 L
FEV6-Pre: 3.62 L
FEV6FVC-%Change-Post: 0 %
FEV6FVC-%Pred-Post: 105 %
FEV6FVC-%Pred-Pre: 105 %
FVC-%Change-Post: 0 %
FVC-%Pred-Post: 91 %
FVC-%Pred-Pre: 91 %
FVC-Post: 3.69 L
FVC-Pre: 3.69 L
Post FEV1/FVC ratio: 73 %
Post FEV6/FVC ratio: 99 %
Pre FEV1/FVC ratio: 68 %
Pre FEV6/FVC Ratio: 98 %
RV % pred: 125 %
RV: 3.16 L
TLC % pred: 96 %
TLC: 6.57 L

## 2019-02-05 MED ORDER — FEXOFENADINE HCL 180 MG PO TABS: 180.0000 mg | ORAL_TABLET | Freq: Every day | ORAL | 11 refills | Status: DC

## 2019-02-05 MED ORDER — BREO ELLIPTA 200-25 MCG/INH IN AEPB: 1.0000 | INHALATION_SPRAY | Freq: Every day | RESPIRATORY_TRACT | 11 refills | Status: DC

## 2019-02-05 NOTE — Progress Notes (Signed)
PFT done today. 

## 2019-02-05 NOTE — Progress Notes (Signed)
Synopsis: Referred in February 2020 for wheezing by Ivan Dress, MD.  Previously a patient of Ivan Day.  Subjective:   PATIENT ID: Ivan Day GENDER: male DOB: 05-11-1940, MRN: [id number]  Chief Complaint  Patient presents with  . Follow-up    Ivan Day is a 78 year old gentleman with a history of sinus polyps, allergic rhinitis, and asthma who presents for follow-up.  He has been doing well since starting Breo in August 2020.  Prior to this he had been on albuterol as needed, but was using it several times per day in July.  At that time he was prescribed prednisone and doxycycline, but his symptoms persisted until starting Breo and Allegra in August.  He wears a mask when he is doing yard work outside.  He has no significant activity limitations; he occasionally stops to rest while doing yard work, which he attributes to age.  He had asthma as a child, and did not have symptoms through most of his adulthood.  He denies coughing, sputum production, wheezing, or nocturnal symptoms.  Since starting Breo he has not needed his albuterol inhaler.  Exhaled NO 05/2018: 15 ppb (done when no symptoms)     Past Medical History:  Diagnosis Date  . Asthma   . COPD (chronic obstructive pulmonary disease) ([place])   . Prostate cancer [facility])      Family History  Problem Relation Age of Onset  . Heart attack Mother   . Diabetes Mother   . Coronary artery disease Father   . Diabetes Sister   . Heart attack Brother   . Diabetes Sister      Past Surgical History:  Procedure Laterality Date  . PROSTATECTOMY      Social History   Socioeconomic History  . Marital status: Married    Spouse name: Not on file  . Number of children: Not on file  . Years of education: Not on file  . Highest education level: Not on file  Occupational History  . Not on file  Social Needs  . Financial resource strain: Not on file  . Food insecurity    Worry: Not on file    Inability: Not on file   . Transportation needs    Medical: Not on file    Non-medical: Not on file  Tobacco Use  . Smoking status: Never Smoker  . Smokeless tobacco: Never Used  Substance and Sexual Activity  . Alcohol use: Never    Frequency: Never  . Drug use: Not Currently  . Sexual activity: Not on file  Lifestyle  . Physical activity    Days per week: Not on file    Minutes per session: Not on file  . Stress: Not on file  Relationships  . Social Herbalist on phone: Not on file    Gets together: Not on file    Attends religious service: Not on file    Active member of club or organization: Not on file    Attends meetings of clubs or organizations: Not on file    Relationship status: Not on file  . Intimate partner violence    Fear of current or ex partner: Not on file    Emotionally abused: Not on file    Physically abused: Not on file    Forced sexual activity: Not on file  Other Topics Concern  . Not on file  Social History Narrative  . Not on file     Allergies  Allergen  Reactions  . Naproxen Hives     Immunization History  Administered Date(s) Administered  . Fluad Quad(high Dose 65+) 01/25/2019  . Influenza-Unspecified 01/28/2018    Outpatient Medications Prior to Visit  Medication Sig Dispense Refill  . albuterol (VENTOLIN HFA) 108 (90 Base) MCG/ACT inhaler Inhale into the lungs every 6 (six) hours as needed for wheezing or shortness of breath.    . co-enzyme Q-10 30 MG capsule Take 100 mg by mouth daily.    Marland Kitchen ipratropium-albuterol (DUONEB) 0.5-2.5 (3) MG/3ML SOLN Take 3 mLs by nebulization every 6 (six) hours as needed.    . Multiple Vitamin (MULTIVITAMIN) tablet Take 1 tablet by mouth daily.    . Omega-3 Fatty Acids (FISH OIL) 1200 MG CAPS Take 1,200 mg by mouth 2 (two) times daily.    . fexofenadine (ALLEGRA ALLERGY) 180 MG tablet Take 1 tablet (180 mg total) by mouth daily. 90 tablet 4  . fluticasone furoate-vilanterol (BREO ELLIPTA) 200-25 MCG/INH AEPB Inhale  1 puff into the lungs daily. 60 each 3  . predniSONE (DELTASONE) 10 MG tablet 4 tabs for 2 days, then 3 tabs for 2 days, 2 tabs for 2 days, then 1 tab for 2 days, then stop 20 tablet 0   No facility-administered medications prior to visit.     Review of Systems  Constitutional: Negative for chills, fever and weight loss.  HENT: Negative for congestion and sinus pain.   Eyes: Negative.   Respiratory: Negative for cough, sputum production, shortness of breath and wheezing.   Cardiovascular: Negative for chest pain and leg swelling.  Gastrointestinal: Negative for blood in stool, heartburn, nausea and vomiting.  Genitourinary: Negative.   Musculoskeletal: Negative for joint pain and myalgias.  Skin: Negative for rash.  Neurological: Negative.   Endo/Heme/Allergies: Positive for environmental allergies.     Objective:   Vitals:   02/05/19 1611  BP: (!) 156/72  Pulse: 85  Temp: 98.1 F (36.7 C)  TempSrc: Oral  SpO2: 95%  Weight: 176 lb (79.8 kg)  Height: 5\' 9"  (1.753 m)   95% on   RA BMI Readings from Last 3 Encounters:  02/05/19 25.99 kg/m  11/11/18 24.82 kg/m  05/31/18 26.16 kg/m   Wt Readings from Last 3 Encounters:  02/05/19 176 lb (79.8 kg)  11/11/18 173 lb (78.5 kg)  05/31/18 170 lb 12.8 oz (77.5 kg)    Physical Exam Vitals signs reviewed.  HENT:     Head: Normocephalic and atraumatic.     Nose:     Comments: Deferred due to masking requirement.    Mouth/Throat:     Comments: Deferred due to masking requirement. Eyes:     General: No scleral icterus. Neck:     Musculoskeletal: Neck supple.  Cardiovascular:     Rate and Rhythm: Normal rate and regular rhythm.     Heart sounds: No murmur.  Pulmonary:     Comments: Breathing comfortably on room air, no tachypnea or conversational dyspnea.  Clear to auscultation bilaterally.  No coughing with deep inhalation. Abdominal:     General: There is no distension.     Palpations: Abdomen is soft.      Tenderness: There is no abdominal tenderness.  Musculoskeletal:        General: No swelling or deformity.  Lymphadenopathy:     Cervical: No cervical adenopathy.  Skin:    General: Skin is warm and dry.     Findings: No rash.  Neurological:     Mental Status: He is  alert.     Coordination: Coordination normal.  Psychiatric:        Mood and Affect: Mood normal.        Behavior: Behavior normal.      CBC    Component Value Date/Time   WBC 8.4 11/11/2018 1515   RBC 4.75 11/11/2018 1515   HGB 14.7 11/11/2018 1515   HCT 43.5 11/11/2018 1515   PLT 264.0 11/11/2018 1515   MCV 91.6 11/11/2018 1515   MCHC 33.7 11/11/2018 1515   RDW 15.2 11/11/2018 1515   LYMPHSABS 2.5 11/11/2018 1515   MONOABS 0.5 11/11/2018 1515   EOSABS 0.6 11/11/2018 1515   BASOSABS 0.2 (H) 11/11/2018 1515   Elevated eosinophils   IgE 271  COVID 02/01/2019 negative   Chest Imaging- films reviewed: CXR, 2 view 11/11/2018-airway thickening, no masses or opacities.  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  Scarring in the left base.  Pulmonary Functions Testing Results: PFT Results Latest Ref Rng & Units 02/05/2019  FVC-Pre L 3.69  FVC-Predicted Pre % 91  FVC-Post L 3.69  FVC-Predicted Post % 91  Pre FEV1/FVC % % 68  Post FEV1/FCV % % 73  FEV1-Pre L 2.50  FEV1-Predicted Pre % 86  FEV1-Post L 2.69  DLCO UNC% % 116  DLCO COR %Predicted % 119  TLC L 6.57  TLC % Predicted % 96  RV % Predicted % 125  No significant obstruction or bronchodilator reversibility.  Air-trapping is present.  No diffusion impairment.  Nov 2019 at [facility]: ratio 68%, FEV1 of 2.14 L (70%) --> 2.21 L and a normal ratio 72% predicted after bronchodilator, some improvement in small airways        Assessment & Plan:     ICD-10-CM   1. Moderate persistent asthma, unspecified whether complicated  123456 Ambulatory referral to Allergy    fluticasone furoate-vilanterol (BREO ELLIPTA) 200-25 MCG/INH AEPB    fexofenadine (ALLEGRA ALLERGY) 180 MG tablet  2. Allergic rhinitis, unspecified seasonality, unspecified trigger  J30.9 Ambulatory referral to Allergy    fluticasone furoate-vilanterol (BREO ELLIPTA) 200-25 MCG/INH AEPB    fexofenadine (ALLEGRA ALLERGY) 180 MG tablet     Moderate persistent asthma, allergic phenotype-stable -Continue Breo once daily.  Reminded to always rinse after every use. -Continue albuterol as needed -Up-to-date on seasonal flu vaccination -Continue Allegra -Referral to allergy at the patient's request.  He has an interest in trying to come off of Allegra and wants to know what he is allergic to or if he should have allergy testing. -If his symptoms become uncontrolled on ICS/LABA, would recommend starting biologic-omalizumab or mepolizumab in the future.  Allergic rhinosinusitis, history of sinus polyps -Continue Allegra -If sinusitis symptoms recur, recommend starting Flonase daily and sinus rinses.   I called the patient's daughter Marvel Plan to update her on our discussion.  RTC in 6 months.   Current Outpatient Medications:  .  albuterol (VENTOLIN HFA) 108 (90 Base) MCG/ACT inhaler, Inhale into the lungs every 6 (six) hours as needed for wheezing or shortness of breath., Disp: , Rfl:  .  co-enzyme Q-10 30 MG capsule, Take 100 mg by mouth daily., Disp: , Rfl:  .  fexofenadine (ALLEGRA ALLERGY) 180 MG tablet, Take 1 tablet (180 mg total) by mouth daily., Disp: 90 tablet, Rfl: 11 .  fluticasone furoate-vilanterol (BREO ELLIPTA)  200-25 MCG/INH AEPB, Inhale 1 puff into the lungs daily., Disp: 2 each, Rfl: 11 .  ipratropium-albuterol (DUONEB) 0.5-2.5 (3) MG/3ML SOLN, Take 3 mLs by nebulization every 6 (six) hours as needed., Disp: , Rfl:  .  Multiple  Vitamin (MULTIVITAMIN) tablet, Take 1 tablet by mouth daily., Disp: , Rfl:  .  Omega-3 Fatty Acids (FISH OIL) 1200 MG CAPS, Take 1,200 mg by mouth 2 (two) times daily., Disp: , Rfl:    Julian Hy, DO [facility] 02/05/2019 5:30 PM

## 2019-02-05 NOTE — Patient Instructions (Addendum)
Thank you for visiting Dr. Carlis Abbott at [facility]. We recommend the following: Orders Placed This Encounter  Procedures  . Ambulatory referral to Allergy   Orders Placed This Encounter  Procedures  . Ambulatory referral to Allergy    Referral Priority:   Routine    Referral Type:   Allergy Testing    Referral Reason:   Specialty Services Required    Requested Specialty:   Allergy    Number of Visits Requested:   1    Meds ordered this encounter  Medications  . fluticasone furoate-vilanterol (BREO ELLIPTA) 200-25 MCG/INH AEPB    Sig: Inhale 1 puff into the lungs daily.    Dispense:  2 each    Refill:  11    Order Specific Question:   Lot Number?    Answer:   [id number]    Order Specific Question:   Expiration Date?    Answer:   05/05/2019    Order Specific Question:   Manufacturer?    Answer:   [employer] [[id number]]    Order Specific Question:   Quantity    Answer:   2    Comments:   samples  . fexofenadine (ALLEGRA ALLERGY) 180 MG tablet    Sig: Take 1 tablet (180 mg total) by mouth daily.    Dispense:  90 tablet    Refill:  11    Return in about 6 months (around 08/05/2019).    Please do your part to reduce the spread of COVID-19.

## 2019-02-05 NOTE — Progress Notes (Signed)
Pulmonary function testing completed today.  This was discussed with patient at office visit with Dr. Carlis Abbott.  Nothing further is needed.  [name], FNP

## 2019-02-10 DIAGNOSIS — J45909 Unspecified asthma, uncomplicated: Secondary | ICD-10-CM | POA: Diagnosis not present

## 2019-02-18 ENCOUNTER — Ambulatory Visit: Payer: Self-pay | Admitting: Pediatrics

## 2019-02-26 ENCOUNTER — Other Ambulatory Visit: Payer: Self-pay

## 2019-02-26 ENCOUNTER — Encounter: Payer: Self-pay | Admitting: Allergy and Immunology

## 2019-02-26 ENCOUNTER — Ambulatory Visit (INDEPENDENT_AMBULATORY_CARE_PROVIDER_SITE_OTHER): Payer: Medicare Other | Admitting: Allergy and Immunology

## 2019-02-26 VITALS — BP 150/70 | HR 76 | Temp 98.4°F | Resp 20 | Ht 68.0 in | Wt 172.6 lb

## 2019-02-26 DIAGNOSIS — J3089 Other allergic rhinitis: Secondary | ICD-10-CM

## 2019-02-26 DIAGNOSIS — J330 Polyp of nasal cavity: Secondary | ICD-10-CM | POA: Diagnosis not present

## 2019-02-26 DIAGNOSIS — J4540 Moderate persistent asthma, uncomplicated: Secondary | ICD-10-CM | POA: Diagnosis not present

## 2019-02-26 HISTORY — DX: Other allergic rhinitis: J30.89

## 2019-02-26 HISTORY — DX: Polyp of nasal cavity: J33.0

## 2019-02-26 MED ORDER — FLUTICASONE PROPIONATE 50 MCG/ACT NA SUSP: NASAL | 5 refills | Status: DC

## 2019-02-26 NOTE — Patient Instructions (Addendum)
Moderate persistent asthma Currently well controlled.  For now, continue Brio Ellipta 200-25 g, 1 inhalation daily, and albuterol every 4-6 hours if needed.  Given the history of asthma as well as nasal polyps, the patient would be a candidate for dupilumab (Dupixent) if symptoms become challenging to manage.  Subjective and objective measures of pulmonary function will be followed and the treatment plan will be adjusted accordingly.  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  Previous lab work indicated elevated eosinophil count and elevated IgE.    Avoidance measures have been discussed and provided in written form.  A prescription has been provided for fluticasone nasal spray, 2 sprays per nostril daily. Proper nasal spray technique has been discussed and demonstrated.  Nasal saline spray (i.e., Simply Saline) or nasal saline lavage (i.e., NeilMed) is recommended as needed and prior to medicated nasal sprays.  For thick post nasal drainage, add guaifenesin [phone] mg (Mucinex)  twice daily as needed with adequate hydration as discussed.  Polyp of nasal cavity  A prescription has been provided for fluticasone nasal spray daily (as above).   Return in about 3 months (around 05/29/2019), or if symptoms worsen or fail to improve.  Control of Mold Allergen  Mold and fungi can grow on a variety of surfaces provided certain temperature and moisture conditions exist.  Outdoor molds grow on plants, decaying vegetation and soil.  The major outdoor mold, Alternaria and Cladosporium, are found in very high numbers during hot and dry conditions.  Generally, a late Summer - Fall peak is seen for common outdoor fungal spores.  Rain will temporarily lower outdoor mold spore count, but counts rise rapidly when the rainy period ends.  The most important indoor molds are  Aspergillus and Penicillium.  Dark, humid and poorly ventilated basements are ideal sites for mold growth.  The next most common sites of mold growth are the bathroom and the kitchen.  Outdoor [employer] 1. Use air conditioning and keep windows closed 2. Avoid exposure to decaying vegetation. 3. Avoid leaf raking. 4. Avoid grain handling. 5. Consider wearing a face mask if working in moldy areas.  Indoor Mold Control 1. Maintain humidity below 50%. 2. Clean washable surfaces with 5% bleach solution. 3. Remove sources e.g. Contaminated carpets.

## 2019-02-26 NOTE — Assessment & Plan Note (Addendum)
Currently well controlled.  For now, continue Brio Ellipta 200-25 g, 1 inhalation daily, and albuterol every 4-6 hours if needed.  Given the history of asthma as well as nasal polyps, the patient would be a candidate for dupilumab (Dupixent) if symptoms become challenging to manage.  Subjective and objective measures of pulmonary function will be followed and the treatment plan will be adjusted accordingly.

## 2019-02-26 NOTE — Assessment & Plan Note (Signed)
   A prescription has been provided for fluticasone nasal spray daily (as above).

## 2019-02-26 NOTE — Assessment & Plan Note (Signed)
Aeroallergen skin test revealed low level reactivity to perennial mold mix #2 and perennial mold mix #4.  Previous lab work indicated elevated eosinophil count and elevated IgE.    Avoidance measures have been discussed and provided in written form.  A prescription has been provided for fluticasone nasal spray, 2 sprays per nostril daily. Proper nasal spray technique has been discussed and demonstrated.  Nasal saline spray (i.e., Simply Saline) or nasal saline lavage (i.e., NeilMed) is recommended as needed and prior to medicated nasal sprays.  For thick post nasal drainage, add guaifenesin [phone] mg (Mucinex)  twice daily as needed with adequate hydration as discussed.

## 2019-02-26 NOTE — Progress Notes (Signed)
New Patient Note  RE: Ivan Day MRN: [id number] DOB: 1940-06-23 Date of Office Visit: 02/26/2019  Referring provider: Julian Hy, DO Primary care provider: Nicoletta Dress, MD  Chief Complaint: Asthma and Allergic Rhinitis   History of present illness: Ivan Day is a 78 y.o. male seen today in consultation requested by Dr. Noemi Chapel.  He is accompanied today by his daughter who assists with the history.  He has a history of childhood asthma, however the asthma had resolved by the time he was an adolescent and was quiescent until October 2019 when he "just started wheezing when night."  The wheezing progressed over the course of the next several weeks and he saw his primary care physician at that time.  He was treated with prednisone and given an albuterol HFA rescue inhaler.  The wheezing improved with prednisone and when the wheezing returned albuterol relieved the wheezing.  He also on occasion would experience some dyspnea/chest tightness and/or coughing.  As the symptoms persisted he was sent to a pulmonologist for evaluation and was started on Breo Ellipta 200-25 micro grams, 1 inhalation daily.  While on the Breo Ellipta his asthma has been well controlled with rare, if any, recurrence of symptoms.  On lab work he was found to have elevated eosinophils and elevated IgE. The patient has a history of nasal polyps.  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  After the brain tumor was removed he did not return to have the nasal polypectomy.  He experiences some nasal congestion as well as postnasal drainage and throat clearing.  He is able to smell and taste.  He has taken fluticasone nasal spray in the past, but not regularly.  He is able to take aspirin without adverse symptoms.  Assessment and plan: Moderate persistent asthma Currently well controlled.  For now, continue Brio Ellipta 200-25 g, 1 inhalation  daily, and albuterol every 4-6 hours if needed.  Given the history of asthma as well as nasal polyps, the patient would be a candidate for dupilumab (Dupixent) if symptoms become challenging to manage.  Subjective and objective measures of pulmonary function will be followed and the treatment plan will be adjusted accordingly.  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  Previous lab work indicated elevated eosinophil count and elevated IgE.    Avoidance measures have been discussed and provided in written form.  A prescription has been provided for fluticasone nasal spray, 2 sprays per nostril daily. Proper nasal spray technique has been discussed and demonstrated.  Nasal saline spray (i.e., Simply Saline) or nasal saline lavage (i.e., NeilMed) is recommended as needed and prior to medicated nasal sprays.  For thick post nasal drainage, add guaifenesin [phone] mg (Mucinex)  twice daily as needed with adequate hydration as discussed.  Polyp of nasal cavity  A prescription has been provided for fluticasone nasal spray daily (as above).   Meds ordered this encounter  Medications  . fluticasone (FLONASE) 50 MCG/ACT nasal spray    Sig: 2 sprays per nostril daily.    Dispense:  16 g    Refill:  5    Diagnostics: Spirometry: Spirometry reveals an FVC of 3.96 L and an FEV1 of 2.74 L 98% predicted) with 160 mL postbronchodilator improvement.  This study was performed while the patient was asymptomatic.  Please  see scanned spirometry results for details. Epicutaneous testing: Negative despite a positive histamine control. Intradermal testing: Positive to perennial mold mix #2 and perennial mold mix #4.  Physical examination: Blood pressure (!) 150/70, pulse 76, temperature 98.4 F (36.9 C), temperature source Oral, resp. rate 20, height 5\' 8"  (1.727 m), weight  172 lb 9.9 oz (78.3 kg), SpO2 96 %.  General: Alert, interactive, in no acute distress. HEENT: TMs pearly gray, turbinates moderately edematous with clear discharge, post-pharynx mildly erythematous.  Nasal polyp visible on the left. Neck: Supple without lymphadenopathy. Lungs: Clear to auscultation without wheezing, rhonchi or rales. CV: Normal S1, S2 without murmurs. Abdomen: Nondistended, nontender. Skin: Warm and dry, without lesions or rashes. Extremities:  No clubbing, cyanosis or edema. Neuro:   Grossly intact.  Review of systems:  Review of systems negative except as noted in HPI / PMHx or noted below: Review of Systems  Constitutional: Negative.   HENT: Negative.   Eyes: Negative.   Respiratory: Negative.   Cardiovascular: Negative.   Gastrointestinal: Negative.   Genitourinary: Negative.   Musculoskeletal: Negative.   Skin: Negative.   Neurological: Negative.   Endo/Heme/Allergies: Negative.   Psychiatric/Behavioral: Negative.     Past medical history:  Past Medical History:  Diagnosis Date  . Asthma   . Brain tumor (benign) ([place])   . COPD (chronic obstructive pulmonary disease) ([place])   . Prostate cancer [facility])     Past surgical history:  Past Surgical History:  Procedure Laterality Date  . BRAIN SURGERY     dermoid tumor removed  . PROSTATECTOMY      Family history: Family History  Problem Relation Age of Onset  . Heart attack Mother   . Diabetes Mother   . Coronary artery disease Father   . Diabetes Sister   . Heart attack Brother   . Diabetes Sister   . Allergic rhinitis Daughter   . Asthma Daughter   . Angioedema Neg Hx   . Eczema Neg Hx   . Immunodeficiency Neg Hx   . Urticaria Neg Hx     Social history: Social History   Socioeconomic History  . Marital status: Married    Spouse name: Not on file  . Number of children: Not on file  . Years of education: Not on file  . Highest education level: Not on file  Occupational History  .  Not on file  Social Needs  . Financial resource strain: Not on file  . Food insecurity    Worry: Not on file    Inability: Not on file  . Transportation needs    Medical: Not on file    Non-medical: Not on file  Tobacco Use  . Smoking status: Never Smoker  . Smokeless tobacco: Never Used  Substance and Sexual Activity  . Alcohol use: Never    Frequency: Never  . Drug use: Not Currently  . Sexual activity: Not on file  Lifestyle  . Physical activity    Days per week: Not on file    Minutes per session: Not on file  . Stress: Not on file  Relationships  . Social Herbalist on phone: Not on file    Gets together: Not on file    Attends religious service: Not on file    Active member of club or organization: Not on file    Attends meetings of clubs or organizations: Not on file    Relationship status: Not on file  . Intimate partner violence  Fear of current or ex partner: Not on file    Emotionally abused: Not on file    Physically abused: Not on file    Forced sexual activity: Not on file  Other Topics Concern  . Not on file  Social History Narrative  . Not on file    Environmental History: The patient lives in a 76 year old house with carpeting throughout, gas heat, and central air.  There is no known mold/water damage in the home.  There are no pets in the home.  He is a never-smoker.  Current Outpatient Medications  Medication Sig Dispense Refill  . albuterol (VENTOLIN HFA) 108 (90 Base) MCG/ACT inhaler Inhale into the lungs every 6 (six) hours as needed for wheezing or shortness of breath.    . Coenzyme Q10 (COQ-10) 100 MG CAPS CoQ-10    . fexofenadine (ALLEGRA ALLERGY) 180 MG tablet Take 1 tablet (180 mg total) by mouth daily. 90 tablet 11  . fluticasone furoate-vilanterol (BREO ELLIPTA) 200-25 MCG/INH AEPB Inhale 1 puff into the lungs daily. 2 each 11  . Multiple Vitamin (MULTIVITAMIN) tablet Take 1 tablet by mouth daily.    . Omega-3 Fatty Acids  (FISH OIL) 1200 MG CAPS Take 1,200 mg by mouth 2 (two) times daily.    . fluticasone (FLONASE) 50 MCG/ACT nasal spray 2 sprays per nostril daily. 16 g 5   No current facility-administered medications for this visit.     Known medication allergies: Allergies  Allergen Reactions  . Naproxen Hives    I appreciate the opportunity to take part in [facility] care. Please do not hesitate to contact me with questions.  Sincerely,   R. Edgar Frisk, MD

## 2019-04-02 DIAGNOSIS — M720 Palmar fascial fibromatosis [Dupuytren]: Secondary | ICD-10-CM | POA: Diagnosis not present

## 2019-04-08 DIAGNOSIS — Z0000 Encounter for general adult medical examination without abnormal findings: Secondary | ICD-10-CM | POA: Diagnosis not present

## 2019-04-08 DIAGNOSIS — Z9181 History of falling: Secondary | ICD-10-CM | POA: Diagnosis not present

## 2019-04-08 DIAGNOSIS — E785 Hyperlipidemia, unspecified: Secondary | ICD-10-CM | POA: Diagnosis not present

## 2019-04-08 DIAGNOSIS — Z139 Encounter for screening, unspecified: Secondary | ICD-10-CM | POA: Diagnosis not present

## 2019-04-17 DIAGNOSIS — M19041 Primary osteoarthritis, right hand: Secondary | ICD-10-CM | POA: Diagnosis not present

## 2019-04-17 DIAGNOSIS — M1811 Unilateral primary osteoarthritis of first carpometacarpal joint, right hand: Secondary | ICD-10-CM | POA: Diagnosis not present

## 2019-04-17 DIAGNOSIS — M720 Palmar fascial fibromatosis [Dupuytren]: Secondary | ICD-10-CM | POA: Diagnosis not present

## 2019-06-04 DIAGNOSIS — R0982 Postnasal drip: Secondary | ICD-10-CM | POA: Diagnosis not present

## 2019-06-24 DIAGNOSIS — M1711 Unilateral primary osteoarthritis, right knee: Secondary | ICD-10-CM | POA: Diagnosis not present

## 2019-06-30 DIAGNOSIS — R0981 Nasal congestion: Secondary | ICD-10-CM | POA: Diagnosis not present

## 2019-07-08 DIAGNOSIS — J320 Chronic maxillary sinusitis: Secondary | ICD-10-CM | POA: Diagnosis not present

## 2019-07-08 DIAGNOSIS — J330 Polyp of nasal cavity: Secondary | ICD-10-CM | POA: Diagnosis not present

## 2019-07-24 ENCOUNTER — Telehealth: Payer: Self-pay | Admitting: Critical Care Medicine

## 2019-07-24 NOTE — Telephone Encounter (Signed)
I called [name], she did not answer. LM for her to call back.  He needs appt for surgical clearance. He has not been seen since 02/05/2019

## 2019-07-25 NOTE — Telephone Encounter (Signed)
LMTCB

## 2019-07-30 DIAGNOSIS — Z01818 Encounter for other preprocedural examination: Secondary | ICD-10-CM | POA: Diagnosis not present

## 2019-07-30 DIAGNOSIS — J328 Other chronic sinusitis: Secondary | ICD-10-CM | POA: Diagnosis not present

## 2019-07-30 NOTE — Telephone Encounter (Signed)
Patient has been scheduled for an appointment with Dr.Clark on 08/11/19 at 4:30. Nothing further needed at this time

## 2019-08-01 DIAGNOSIS — J320 Chronic maxillary sinusitis: Secondary | ICD-10-CM | POA: Insufficient documentation

## 2019-08-01 HISTORY — DX: Chronic maxillary sinusitis: J32.0

## 2019-08-04 HISTORY — PX: NASAL SINUS SURGERY: SHX719

## 2019-08-11 ENCOUNTER — Encounter: Payer: Self-pay | Admitting: Critical Care Medicine

## 2019-08-11 ENCOUNTER — Ambulatory Visit: Payer: Medicare Other | Admitting: Critical Care Medicine

## 2019-08-11 ENCOUNTER — Other Ambulatory Visit: Payer: Self-pay

## 2019-08-11 VITALS — BP 128/62 | HR 72 | Temp 97.8°F | Ht 69.0 in | Wt 180.4 lb

## 2019-08-11 DIAGNOSIS — J309 Allergic rhinitis, unspecified: Secondary | ICD-10-CM

## 2019-08-11 DIAGNOSIS — J4540 Moderate persistent asthma, uncomplicated: Secondary | ICD-10-CM | POA: Diagnosis not present

## 2019-08-11 DIAGNOSIS — Z01818 Encounter for other preprocedural examination: Secondary | ICD-10-CM

## 2019-08-11 NOTE — Progress Notes (Signed)
Synopsis: Referred in February 2020 for wheezing by Nicoletta Dress, MD.  Previously a patient of Dr. Lake Bells.  Subjective:   PATIENT ID: Ivan Day GENDER: male DOB: [birth year], MRN: [id number]  Chief Complaint  Patient presents with  . Follow-up    no complaints of cough/ whezzing or SOB    Ivan Day is a 79 y/o gentleman who presents for follow up of asthma.  He is accompanied by his daughter today.  He has been doing well since his most recent visit.  He continues on Breo once daily.  No wheezing, coughing, nocturnal symptoms.  He had worse allergy symptoms recently requiring prednisone and nasal spray, by taking Allegra.  He followed up with his ENT, Dr. Venetia Maxon at [facility] who recommended surgery for his sinus polyps.  He has outpatient sinus surgery planned for next week.  ACT 25.  He is up to date on his Covid shot and flu shot.  He thinks he has had his pneumonia vaccines in the past.   OV 02/04/2001: Ivan Day is a 79 year old gentleman with a history of sinus polyps, allergic rhinitis, and asthma who presents for follow-up.  He has been doing well since starting Breo in August 2020.  Prior to this he had been on albuterol as needed, but was using it several times per day in July.  At that time he was prescribed prednisone and doxycycline, but his symptoms persisted until starting Breo and Allegra in August.  He wears a mask when he is doing yard work outside.  He has no significant activity limitations; he occasionally stops to rest while doing yard work, which he attributes to age.  He had asthma as a child, and did not have symptoms through most of his adulthood.  He denies coughing, sputum production, wheezing, or nocturnal symptoms.  Since starting Breo he has not needed his albuterol inhaler.  Exhaled NO 05/2018: 15 ppb (done when no symptoms)    Past Medical History:  Diagnosis Date  . Asthma   . Brain tumor (benign) ([place])   . COPD (chronic obstructive pulmonary  disease) ([place])   . Prostate cancer [facility])      Family History  Problem Relation Age of Onset  . Heart attack Mother   . Diabetes Mother   . Coronary artery disease Father   . Diabetes Sister   . Heart attack Brother   . Diabetes Sister   . Allergic rhinitis Daughter   . Asthma Daughter   . Angioedema Neg Hx   . Eczema Neg Hx   . Immunodeficiency Neg Hx   . Urticaria Neg Hx      Past Surgical History:  Procedure Laterality Date  . BRAIN SURGERY     dermoid tumor removed  . PROSTATECTOMY      Social History   Socioeconomic History  . Marital status: Married    Spouse name: Not on file  . Number of children: Not on file  . Years of education: Not on file  . Highest education level: Not on file  Occupational History  . Not on file  Tobacco Use  . Smoking status: Never Smoker  . Smokeless tobacco: Never Used  Substance and Sexual Activity  . Alcohol use: Never  . Drug use: Not Currently  . Sexual activity: Not on file  Other Topics Concern  . Not on file  Social History Narrative  . Not on file   Social Determinants of Health   Financial Resource Strain:   .  Difficulty of Paying Living Expenses:   Food Insecurity:   . Worried About Charity fundraiser in the Last Year:   . Arboriculturist in the Last Year:   Transportation Needs:   . Film/video editor (Medical):   Marland Kitchen Lack of Transportation (Non-Medical):   Physical Activity:   . Days of Exercise per Week:   . Minutes of Exercise per Session:   Stress:   . Feeling of Stress :   Social Connections:   . Frequency of Communication with Friends and Family:   . Frequency of Social Gatherings with Friends and Family:   . Attends Religious Services:   . Active Member of Clubs or Organizations:   . Attends Archivist Meetings:   Marland Kitchen Marital Status:   Intimate Partner Violence:   . Fear of Current or Ex-Partner:   . Emotionally Abused:   Marland Kitchen Physically Abused:   . Sexually Abused:      Allergies   Allergen Reactions  . Naproxen Hives     Immunization History  Administered Date(s) Administered  . Fluad Quad(high Dose 65+) 01/25/2019  . Influenza-Unspecified 01/28/2018  . PFIZER SARS-COV-2 Vaccination 04/19/2019, 05/10/2019    Outpatient Medications Prior to Visit  Medication Sig Dispense Refill  . albuterol (VENTOLIN HFA) 108 (90 Base) MCG/ACT inhaler Inhale into the lungs every 6 (six) hours as needed for wheezing or shortness of breath.    . Coenzyme Q10 (COQ-10) 100 MG CAPS CoQ-10    . fexofenadine (ALLEGRA ALLERGY) 180 MG tablet Take 1 tablet (180 mg total) by mouth daily. 90 tablet 11  . fluticasone furoate-vilanterol (BREO ELLIPTA) 200-25 MCG/INH AEPB Inhale 1 puff into the lungs daily. 2 each 11  . Omega-3 Fatty Acids (FISH OIL) 1200 MG CAPS Take 1,200 mg by mouth 2 (two) times daily.    . fluticasone (FLONASE) 50 MCG/ACT nasal spray 2 sprays per nostril daily. (Patient not taking: Reported on 08/11/2019) 16 g 5  . Multiple Vitamin (MULTIVITAMIN) tablet Take 1 tablet by mouth daily.     No facility-administered medications prior to visit.    Review of Systems  Constitutional: Negative for chills, fever and weight loss.  HENT: Negative for congestion and sinus pain.   Eyes: Negative.   Respiratory: Negative for cough, sputum production, shortness of breath and wheezing.   Cardiovascular: Negative for chest pain and leg swelling.  Gastrointestinal: Negative for blood in stool, heartburn, nausea and vomiting.  Genitourinary: Negative.   Musculoskeletal: Negative for joint pain and myalgias.  Skin: Negative for rash.  Neurological: Negative.   Endo/Heme/Allergies: Positive for environmental allergies.     Objective:   Vitals:   08/11/19 1635  BP: 128/62  Pulse: 72  Temp: 97.8 F (36.6 C)  TempSrc: Temporal  SpO2: 97%  Weight: 180 lb 6.4 oz (81.8 kg)  Height: 5\' 9"  (1.753 m)   97% on   RA BMI Readings from Last 3 Encounters:  08/11/19 26.64 kg/m   02/26/19 26.25 kg/m  02/05/19 25.99 kg/m   Wt Readings from Last 3 Encounters:  08/11/19 180 lb 6.4 oz (81.8 kg)  02/26/19 172 lb 9.9 oz (78.3 kg)  02/05/19 176 lb (79.8 kg)    Physical Exam Vitals reviewed.  Constitutional:      Appearance: Normal appearance. He is not ill-appearing.  HENT:     Head: Normocephalic and atraumatic.  Eyes:     General: No scleral icterus. Cardiovascular:     Rate and Rhythm: Normal rate and  regular rhythm.  Pulmonary:     Comments: Breathing comfortably on room air, no conversational dyspnea.  Clear to auscultation bilaterally. Abdominal:     General: There is no distension.     Palpations: Abdomen is soft.  Musculoskeletal:        General: No swelling or deformity.     Cervical back: Neck supple.  Lymphadenopathy:     Cervical: No cervical adenopathy.  Skin:    General: Skin is warm.     Findings: No rash.  Neurological:     General: No focal deficit present.     Mental Status: He is alert.     Coordination: Coordination normal.  Psychiatric:        Mood and Affect: Mood normal.        Behavior: Behavior normal.      CBC    Component Value Date/Time   WBC 8.4 11/11/2018 1515   RBC 4.75 11/11/2018 1515   HGB 14.7 11/11/2018 1515   HCT 43.5 11/11/2018 1515   PLT 264.0 11/11/2018 1515   MCV 91.6 11/11/2018 1515   MCHC 33.7 11/11/2018 1515   RDW 15.2 11/11/2018 1515   LYMPHSABS 2.5 11/11/2018 1515   MONOABS 0.5 11/11/2018 1515   EOSABS 0.6 11/11/2018 1515   BASOSABS 0.2 (H) 11/11/2018 1515   Elevated eosinophils 600 IgE 271  COVID 02/01/2019 negative  Chest Imaging- films reviewed: CXR, 2 view 11/11/2018-airway thickening, no masses or opacities.  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   Scarring in the left base.  Pulmonary Functions Testing Results: PFT Results Latest Ref Rng & Units 02/05/2019  FVC-Pre L 3.69  FVC-Predicted Pre % 91  FVC-Post L 3.69  FVC-Predicted Post % 91  Pre FEV1/FVC % % 68  Post FEV1/FCV % % 73  FEV1-Pre L 2.50  FEV1-Predicted Pre % 86  FEV1-Post L 2.69  DLCO UNC% % 116  DLCO COR %Predicted % 119  TLC L 6.57  TLC % Predicted % 96  RV % Predicted % 125   2020- No significant obstruction or bronchodilator reversibility.  Air-trapping is present.  No diffusion impairment.  Nov 2019 at [facility]: ratio 68%, FEV1 of 2.14 L (70%) --> 2.21 L and a normal ratio 72% predicted after bronchodilator, some improvement in small airways       Assessment & Plan:     ICD-10-CM   1. Pre-op examination  Z01.818   2. Moderate persistent asthma, unspecified whether complicated  123456   3. Allergic rhinitis, unspecified seasonality, unspecified trigger  J30.9      Moderate persistent asthma, allergic phenotype- stable -Continue Breo once daily.  With upcoming surgery and recent worse allergies not an appropriate time to consider de-escalation.  Can de-escalate at follow-up if still doing well. -Continue albuterol as needed -Needs to use Breo the day of his surgery.  He should have albuterol available at the surgery center and available when he gets home as inhaled anesthetics may worsen asthma. -Up-to-date on seasonal flu and Covid vaccines.  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  He is optimized for surgery from a pulmonary standpoint.  Allergic rhinosinusitis, history of sinus polyps -Continue Allegra -Agree with sinus surgery  RTC in 6 months.   Current Outpatient Medications:  .  albuterol (VENTOLIN HFA) 108 (90 Base) MCG/ACT inhaler, Inhale into the lungs every 6 (six) hours as needed for wheezing or  shortness of breath., Disp: , Rfl:  .  Coenzyme Q10 (COQ-10) 100 MG CAPS, CoQ-10, Disp: , Rfl:  .  fexofenadine (ALLEGRA ALLERGY) 180 MG tablet, Take 1 tablet (180 mg total) by mouth daily., Disp: 90 tablet, Rfl: 11 .  fluticasone furoate-vilanterol (BREO ELLIPTA) 200-25 MCG/INH AEPB, Inhale 1 puff into the lungs daily., Disp: 2 each, Rfl: 11 .  Omega-3 Fatty Acids (FISH OIL) 1200 MG CAPS, Take 1,200 mg by mouth 2 (two) times daily., Disp: , Rfl:  .  fluticasone (FLONASE) 50 MCG/ACT nasal spray, 2 sprays per nostril daily. (Patient not taking: Reported on 08/11/2019), Disp: 16 g, Rfl: 5 .  Multiple Vitamin (MULTIVITAMIN) tablet, Take 1 tablet by mouth daily., Disp: , Rfl:    Julian Hy, DO [facility] 08/11/2019 5:49 PM

## 2019-08-11 NOTE — Patient Instructions (Addendum)
Thank you for visiting Dr. Carlis Abbott at [facility]. We recommend the following:  Continue Breo once daily. Use it before your sinus surgery. Continue albuterol up to every 4 hours as needed.  Return in about 6 months (around 02/11/2020).    Please do your part to reduce the spread of COVID-19.

## 2019-08-20 DIAGNOSIS — J323 Chronic sphenoidal sinusitis: Secondary | ICD-10-CM | POA: Diagnosis not present

## 2019-08-20 DIAGNOSIS — J339 Nasal polyp, unspecified: Secondary | ICD-10-CM | POA: Diagnosis not present

## 2019-08-20 DIAGNOSIS — J320 Chronic maxillary sinusitis: Secondary | ICD-10-CM | POA: Diagnosis not present

## 2019-08-20 DIAGNOSIS — J3489 Other specified disorders of nose and nasal sinuses: Secondary | ICD-10-CM | POA: Diagnosis not present

## 2019-08-20 DIAGNOSIS — J322 Chronic ethmoidal sinusitis: Secondary | ICD-10-CM | POA: Diagnosis not present

## 2019-08-20 DIAGNOSIS — J321 Chronic frontal sinusitis: Secondary | ICD-10-CM | POA: Diagnosis not present

## 2019-08-20 DIAGNOSIS — J330 Polyp of nasal cavity: Secondary | ICD-10-CM | POA: Diagnosis not present

## 2019-08-20 DIAGNOSIS — J324 Chronic pansinusitis: Secondary | ICD-10-CM | POA: Diagnosis not present

## 2019-08-20 DIAGNOSIS — J338 Other polyp of sinus: Secondary | ICD-10-CM | POA: Diagnosis not present

## 2019-08-20 DIAGNOSIS — J45909 Unspecified asthma, uncomplicated: Secondary | ICD-10-CM | POA: Diagnosis not present

## 2019-09-04 DIAGNOSIS — J322 Chronic ethmoidal sinusitis: Secondary | ICD-10-CM | POA: Diagnosis not present

## 2019-09-04 DIAGNOSIS — J320 Chronic maxillary sinusitis: Secondary | ICD-10-CM | POA: Diagnosis not present

## 2019-10-01 DIAGNOSIS — L237 Allergic contact dermatitis due to plants, except food: Secondary | ICD-10-CM | POA: Diagnosis not present

## 2019-10-07 DIAGNOSIS — J322 Chronic ethmoidal sinusitis: Secondary | ICD-10-CM | POA: Diagnosis not present

## 2019-10-07 DIAGNOSIS — J320 Chronic maxillary sinusitis: Secondary | ICD-10-CM | POA: Diagnosis not present

## 2019-11-19 IMAGING — DX CHEST - 2 VIEW
2 series · 2 of 2 positions shown · non-contrast
Comparison: 04/08/2018

CLINICAL DATA: Wheezing for several days

EXAM:
CHEST - 2 VIEW

[chest pa]
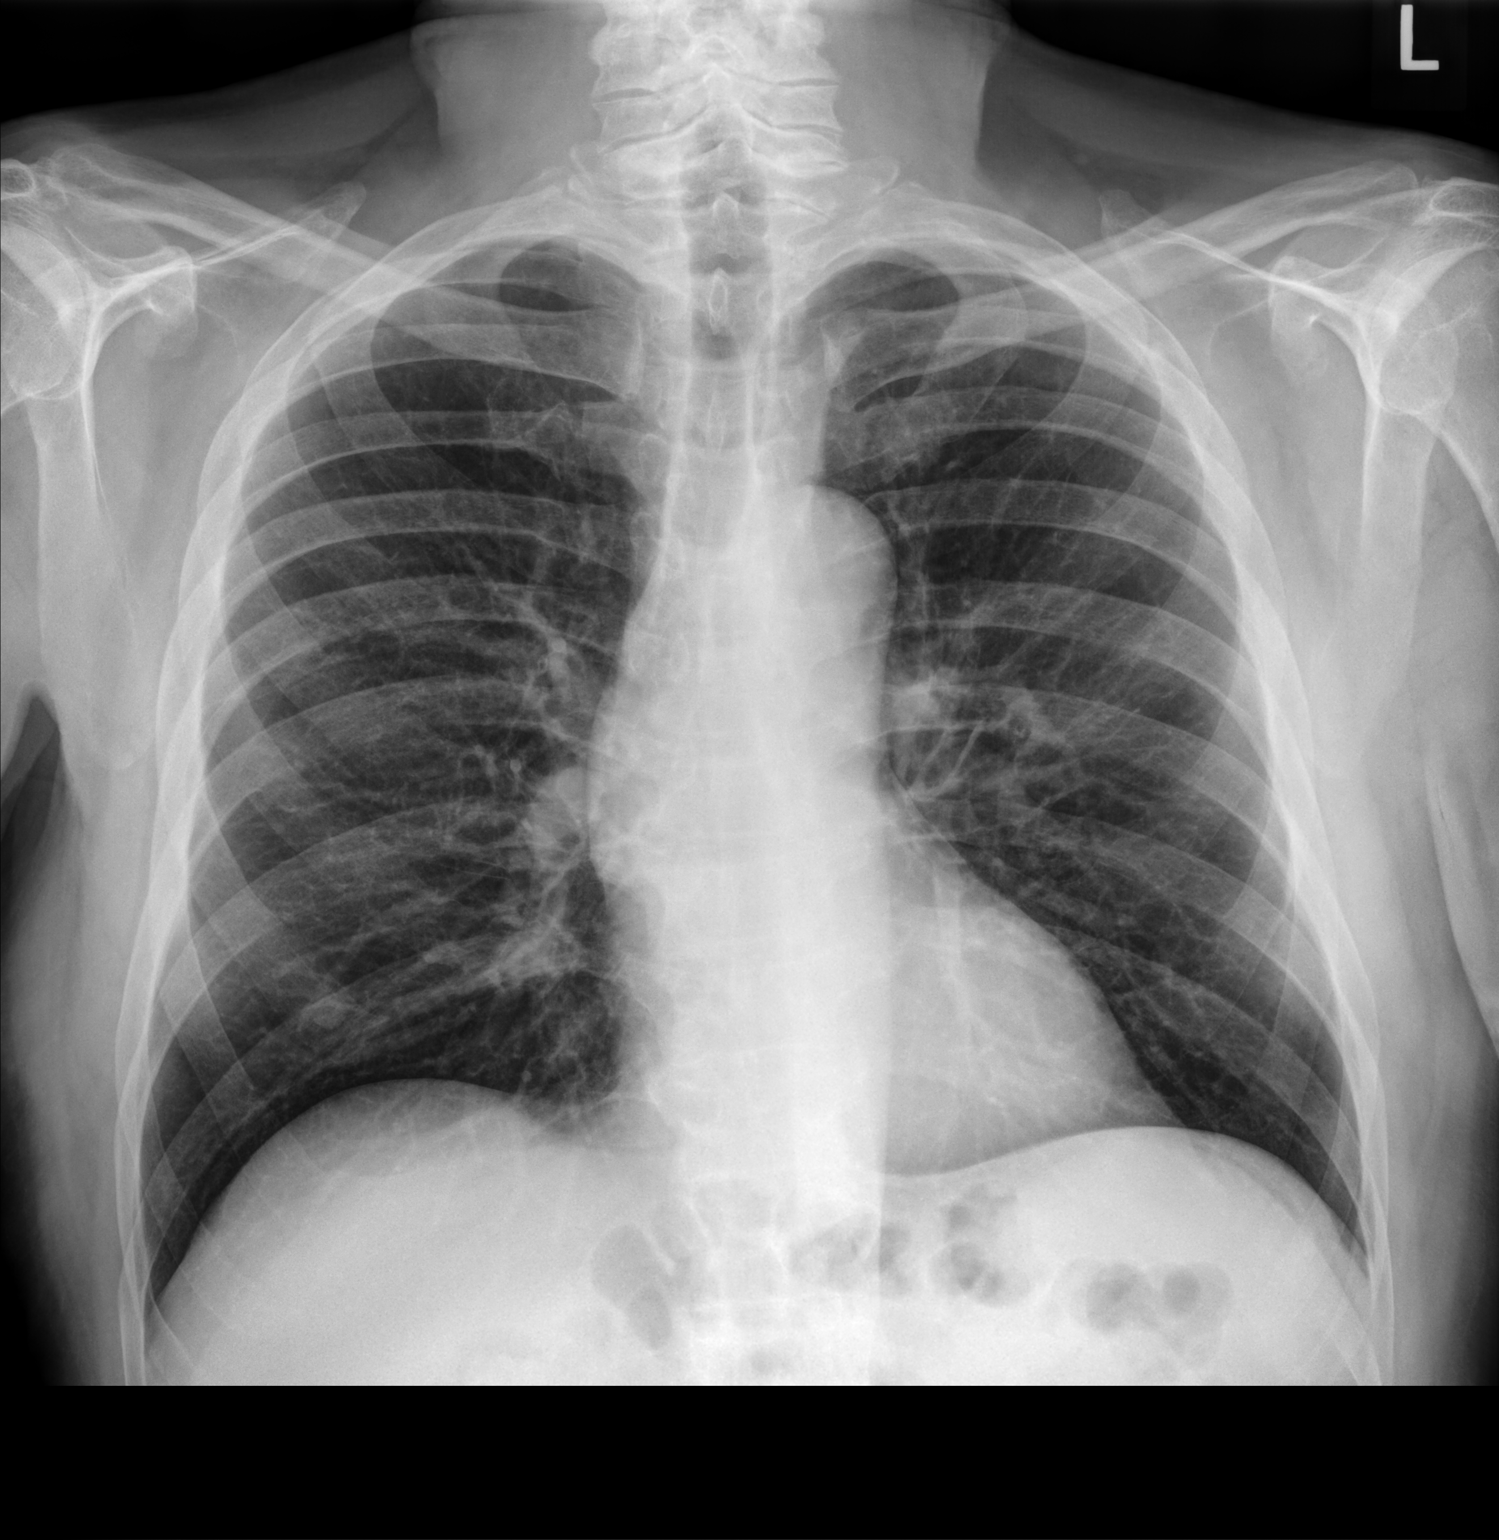

[chest lat]
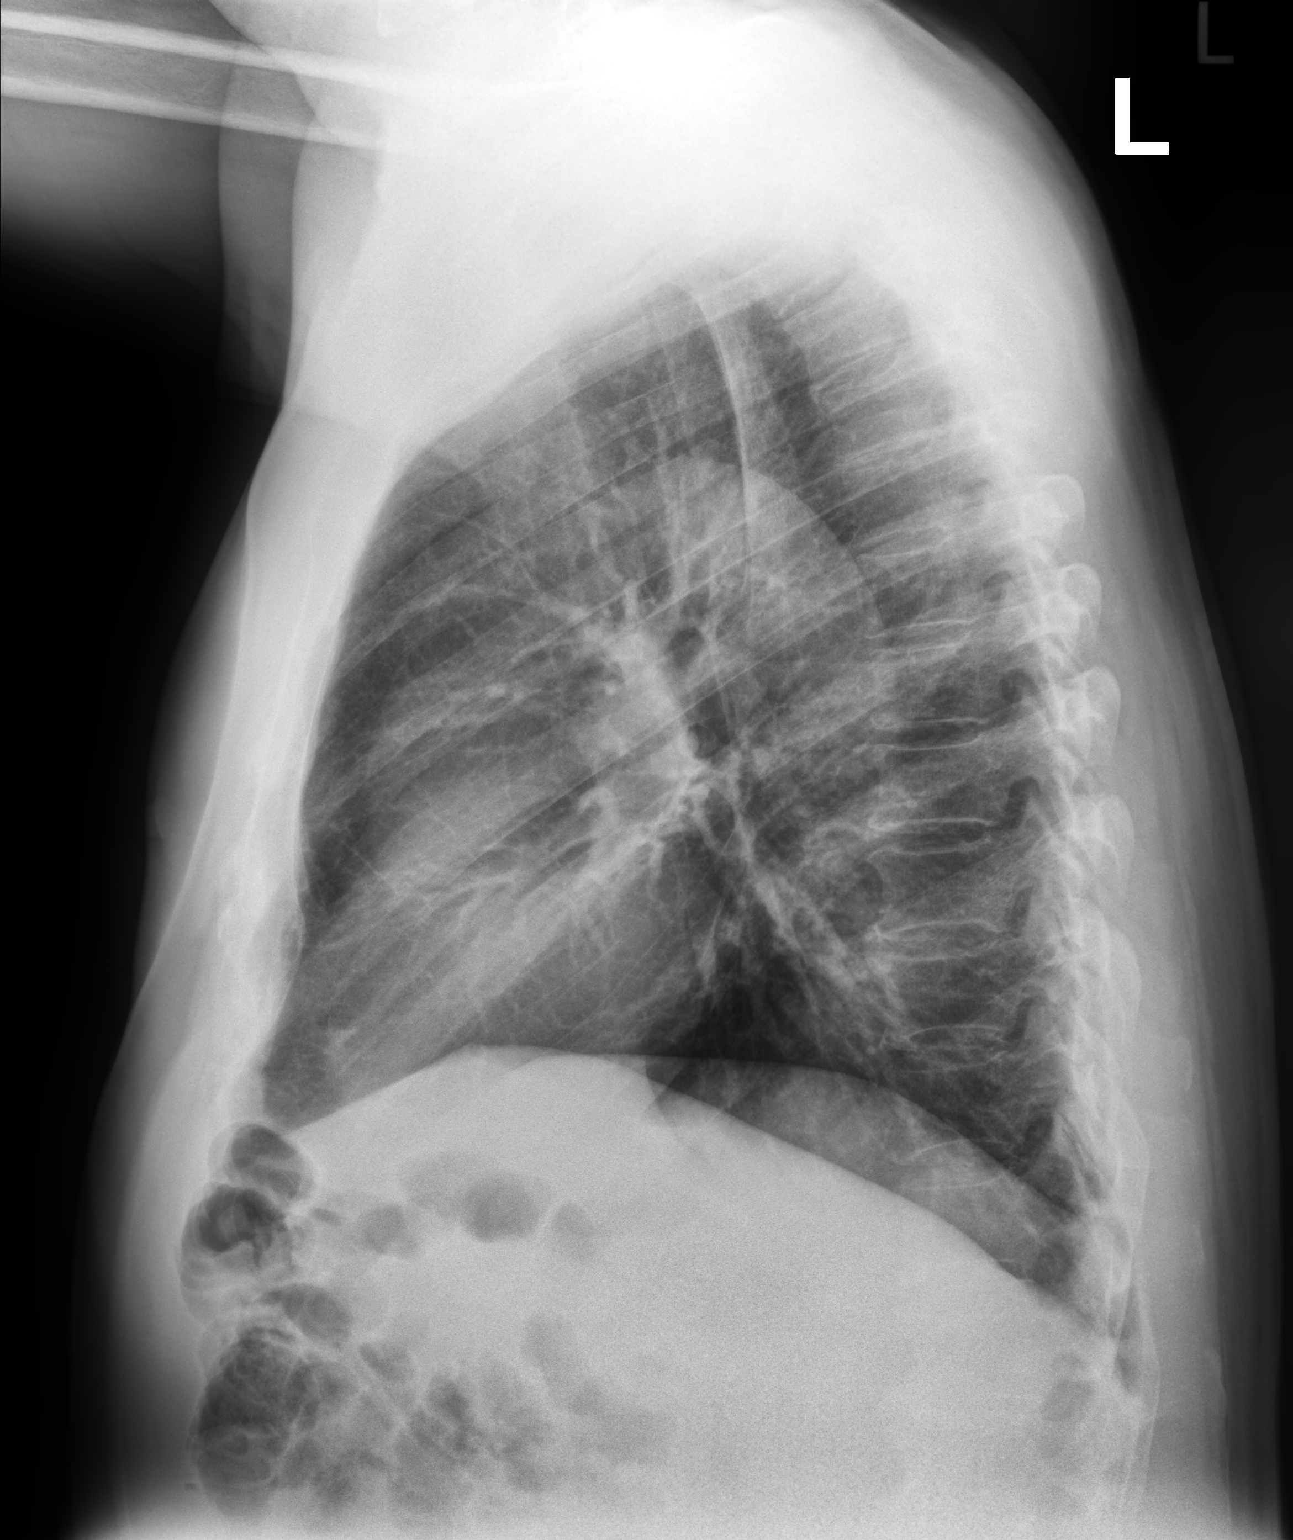

[2 of 2 positions shown; findings below may reference images not displayed]

FINDINGS: The heart size and mediastinal contours are within normal limits.
Both lungs are clear. The visualized skeletal structures are
unremarkable.
IMPRESSION: No active cardiopulmonary disease.

## 2020-01-08 DIAGNOSIS — Z23 Encounter for immunization: Secondary | ICD-10-CM | POA: Diagnosis not present

## 2020-01-13 ENCOUNTER — Other Ambulatory Visit: Payer: Self-pay

## 2020-01-13 ENCOUNTER — Encounter: Payer: Self-pay | Admitting: Critical Care Medicine

## 2020-01-13 ENCOUNTER — Telehealth: Payer: Self-pay | Admitting: Critical Care Medicine

## 2020-01-13 ENCOUNTER — Ambulatory Visit: Payer: Medicare Other | Admitting: Critical Care Medicine

## 2020-01-13 VITALS — BP 118/70 | HR 71 | Temp 98.2°F | Ht 70.0 in | Wt 179.6 lb

## 2020-01-13 DIAGNOSIS — J4530 Mild persistent asthma, uncomplicated: Secondary | ICD-10-CM | POA: Diagnosis not present

## 2020-01-13 MED ORDER — BREO ELLIPTA 100-25 MCG/INH IN AEPB: 1.0000 | INHALATION_SPRAY | Freq: Every day | RESPIRATORY_TRACT | 11 refills | Status: DC

## 2020-01-13 NOTE — Patient Instructions (Addendum)
Thank you for visiting Dr. Carlis Abbott at [facility]. We recommend the following:   Meds ordered this encounter  Medications  . fluticasone furoate-vilanterol (BREO ELLIPTA) 100-25 MCG/INH AEPB    Sig: Inhale 1 puff into the lungs daily.    Dispense:  1 each    Refill:  11    Return in about 6 months (around 07/13/2020). with Dr. Shearon Stalls (30 minutes visit).    Please do your part to reduce the spread of COVID-19.

## 2020-01-13 NOTE — Progress Notes (Signed)
Synopsis: Referred in February 2020 for wheezing by Nicoletta Dress, MD.  Previously a patient of Dr. Lake Bells.  Subjective:   PATIENT ID: Ivan Day GENDER: male DOB: Dec 04, 1940, MRN: [id number]  Chief Complaint  Patient presents with  . Follow-up    [name] is a 79 year old gentleman with a history of nasal polyps and asthma who presents for follow-up.  Since his last visit he had significant spinal surgery on 08/20/2019.  He had debridement and polyp resection of all of his sinuses and a prolonged steroid taper afterwards.  He was followed up with ENT on 10/07/2019 and required an office debridement to open his ethmoid sinuses.  He is no longer on intranasal steroids.  His next ENT follow-up is March 2022.  He has been doing well on his Breo.  He has not needed albuterol since he was started on Breo.  He denies wheezing, coughing, nocturnal symptoms.  He has no activity limitations.  He always wears a mask when doing yard work to prevent symptoms.  He received his flu shot last week and is getting his Covid booster soon.  He is previously seen Dr. Verlin Fester from allergy and immunology but has not seen him recently. ACT 25.     OV 08/11/19: [name] is a 79 y/o gentleman who presents for follow up of asthma.  He is accompanied by his daughter today.  He has been doing well since his most recent visit.  He continues on Breo once daily.  No wheezing, coughing, nocturnal symptoms.  He had worse allergy symptoms recently requiring prednisone and nasal spray, by taking Allegra.  He followed up with his ENT, Dr. Venetia Maxon at [facility] who recommended surgery for his sinus polyps.  He has outpatient sinus surgery planned for next week.  ACT 25.  He is up to date on his Covid shot and flu shot.  He thinks he has had his pneumonia vaccines in the past.   OV 02/04/2001: [name] is a 79 year old gentleman with a history of sinus polyps, allergic rhinitis, and asthma who presents for follow-up.  He has  been doing well since starting Breo in August 2020.  Prior to this he had been on albuterol as needed, but was using it several times per day in July.  At that time he was prescribed prednisone and doxycycline, but his symptoms persisted until starting Breo and Allegra in August.  He wears a mask when he is doing yard work outside.  He has no significant activity limitations; he occasionally stops to rest while doing yard work, which he attributes to age.  He had asthma as a child, and did not have symptoms through most of his adulthood.  He denies coughing, sputum production, wheezing, or nocturnal symptoms.  Since starting Breo he has not needed his albuterol inhaler.  Exhaled NO 05/2018: 15 ppb (done when no symptoms)    Past Medical History:  Diagnosis Date  . Asthma   . Brain tumor (benign) ([place])   . COPD (chronic obstructive pulmonary disease) ([place])   . Prostate cancer [facility])      Family History  Problem Relation Age of Onset  . Heart attack Mother   . Diabetes Mother   . Coronary artery disease Father   . Diabetes Sister   . Heart attack Brother   . Diabetes Sister   . Allergic rhinitis Daughter   . Asthma Daughter   . Angioedema Neg Hx   . Eczema Neg Hx   .  Immunodeficiency Neg Hx   . Urticaria Neg Hx      Past Surgical History:  Procedure Laterality Date  . BRAIN SURGERY     dermoid tumor removed  . PROSTATECTOMY      Social History   Socioeconomic History  . Marital status: Married    Spouse name: Not on file  . Number of children: Not on file  . Years of education: Not on file  . Highest education level: Not on file  Occupational History  . Not on file  Tobacco Use  . Smoking status: Never Smoker  . Smokeless tobacco: Never Used  Vaping Use  . Vaping Use: Never used  Substance and Sexual Activity  . Alcohol use: Never  . Drug use: Not Currently  . Sexual activity: Not on file  Other Topics Concern  . Not on file  Social History Narrative  . Not on  file   Social Determinants of Health   Financial Resource Strain:   . Difficulty of Paying Living Expenses: Not on file  Food Insecurity:   . Worried About Charity fundraiser in the Last Year: Not on file  . Ran Out of Food in the Last Year: Not on file  Transportation Needs:   . Lack of Transportation (Medical): Not on file  . Lack of Transportation (Non-Medical): Not on file  Physical Activity:   . Days of Exercise per Week: Not on file  . Minutes of Exercise per Session: Not on file  Stress:   . Feeling of Stress : Not on file  Social Connections:   . Frequency of Communication with Friends and Family: Not on file  . Frequency of Social Gatherings with Friends and Family: Not on file  . Attends Religious Services: Not on file  . Active Member of Clubs or Organizations: Not on file  . Attends Archivist Meetings: Not on file  . Marital Status: Not on file  Intimate Partner Violence:   . Fear of Current or Ex-Partner: Not on file  . Emotionally Abused: Not on file  . Physically Abused: Not on file  . Sexually Abused: Not on file     Allergies  Allergen Reactions  . Naproxen Hives     Immunization History  Administered Date(s) Administered  . Fluad Quad(high Dose 65+) 01/25/2019, 01/09/2020  . Influenza,inj,quad, With Preservative 01/19/2018  . Influenza-Unspecified 01/28/2018  . PFIZER SARS-COV-2 Vaccination 04/19/2019, 05/10/2019    Outpatient Medications Prior to Visit  Medication Sig Dispense Refill  . albuterol (VENTOLIN HFA) 108 (90 Base) MCG/ACT inhaler Inhale into the lungs every 6 (six) hours as needed for wheezing or shortness of breath.    . Coenzyme Q10 (COQ-10) 100 MG CAPS CoQ-10    . fexofenadine (ALLEGRA ALLERGY) 180 MG tablet Take 1 tablet (180 mg total) by mouth daily. 90 tablet 11  . fluticasone furoate-vilanterol (BREO ELLIPTA) 200-25 MCG/INH AEPB Inhale 1 puff into the lungs daily. 2 each 11  . fluticasone (FLONASE) 50 MCG/ACT nasal  spray 2 sprays per nostril daily. (Patient not taking: Reported on 08/11/2019) 16 g 5  . Multiple Vitamin (MULTIVITAMIN) tablet Take 1 tablet by mouth daily.    . Omega-3 Fatty Acids (FISH OIL) 1200 MG CAPS Take 1,200 mg by mouth 2 (two) times daily.     No facility-administered medications prior to visit.    Review of Systems  Constitutional: Negative for chills, fever and weight loss.  HENT: Negative for congestion and sinus pain.   Eyes:  Negative.   Respiratory: Negative for cough, sputum production, shortness of breath and wheezing.   Cardiovascular: Negative for chest pain and leg swelling.  Gastrointestinal: Negative for blood in stool, heartburn, nausea and vomiting.  Genitourinary: Negative.   Musculoskeletal: Negative for joint pain and myalgias.  Skin: Negative for rash.  Neurological: Negative.   Endo/Heme/Allergies: Positive for environmental allergies.     Objective:   Vitals:   01/13/20 1533  BP: 118/70  Pulse: 71  Temp: 98.2 F (36.8 C)  TempSrc: Temporal  SpO2: 96%  Weight: 179 lb 9.6 oz (81.5 kg)  Height: 5\' 10"  (1.778 m)   96% on   RA BMI Readings from Last 3 Encounters:  01/13/20 25.77 kg/m  08/11/19 26.64 kg/m  02/26/19 26.25 kg/m   Wt Readings from Last 3 Encounters:  01/13/20 179 lb 9.6 oz (81.5 kg)  08/11/19 180 lb 6.4 oz (81.8 kg)  02/26/19 172 lb 9.9 oz (78.3 kg)    Physical Exam Vitals reviewed.  Constitutional:      General: He is not in acute distress.    Appearance: Normal appearance. He is not ill-appearing.  HENT:     Head: Normocephalic and atraumatic.  Eyes:     General: No scleral icterus. Cardiovascular:     Rate and Rhythm: Normal rate and regular rhythm.     Heart sounds: No murmur heard.   Pulmonary:     Comments: Breathing comfortably on room air, no conversational dyspnea.  Clear to auscultation bilaterally. Abdominal:     General: There is no distension.     Palpations: Abdomen is soft.     Tenderness: There  is no abdominal tenderness.  Musculoskeletal:        General: No swelling or deformity.     Cervical back: Neck supple.  Lymphadenopathy:     Cervical: No cervical adenopathy.  Skin:    General: Skin is warm and dry.     Findings: No rash.  Neurological:     General: No focal deficit present.     Mental Status: He is alert.     Coordination: Coordination normal.  Psychiatric:        Mood and Affect: Mood normal.        Behavior: Behavior normal.      CBC    Component Value Date/Time   WBC 8.4 11/11/2018 1515   RBC 4.75 11/11/2018 1515   HGB 14.7 11/11/2018 1515   HCT 43.5 11/11/2018 1515   PLT 264.0 11/11/2018 1515   MCV 91.6 11/11/2018 1515   MCHC 33.7 11/11/2018 1515   RDW 15.2 11/11/2018 1515   LYMPHSABS 2.5 11/11/2018 1515   MONOABS 0.5 11/11/2018 1515   EOSABS 0.6 11/11/2018 1515   BASOSABS 0.2 (H) 11/11/2018 1515   Elevated eosinophils 600 IgE 271  COVID 02/01/2019 negative  Chest Imaging- films reviewed: CXR, 2 view 11/11/2018-airway thickening, no masses or opacities.  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  Scarring in the left base.  Pulmonary Functions Testing Results: PFT Results Latest Ref Rng & Units 02/05/2019  FVC-Pre L 3.69  FVC-Predicted Pre % 91  FVC-Post L 3.69  FVC-Predicted Post % 91  Pre FEV1/FVC % % 68  Post FEV1/FCV % % 73  FEV1-Pre L 2.50  FEV1-Predicted Pre % 86  FEV1-Post L 2.69  DLCO uncorrected  ml/min/mmHg 28.23  DLCO UNC% % 116  DLVA Predicted % 119  TLC L 6.57  TLC % Predicted % 96  RV % Predicted % 125   2020- No significant obstruction or bronchodilator reversibility.  Air-trapping is present.  No diffusion impairment.  Nov 2019 at [facility]: ratio 68%, FEV1 of 2.14 L (70%) --> 2.21 L and a  normal ratio 72% predicted after bronchodilator, some improvement in small airways       Assessment & Plan:     ICD-10-CM   1. Mild persistent asthma without complication  K93.55      Moderate persistent asthma, allergic phenotype (elevated IgE)- very stable -De-escalate to low-dose Breo 100-50 once daily.  Precautions given that if he has increase in wheezing, coughing, nocturnal symptoms, or finds himself using his albuterol more often he should increase back to his Breo 200 and let us know. -Continue albuterol as needed -Up-to-date on seasonal flu and Covid vaccines.  Agree with Covid booster.  He has had pneumococcal vaccinations elsewhere. -Continue Allegra daily  Allergic rhinosinusitis, history of sinus polyps -Continue Allegra -Follow-up with ENT as recommended   RTC in 6 months with Dr. Shearon Stalls.   Current Outpatient Medications:  .  albuterol (VENTOLIN HFA) 108 (90 Base) MCG/ACT inhaler, Inhale into the lungs every 6 (six) hours as needed for wheezing or shortness of breath., Disp: , Rfl:  .  Coenzyme Q10 (COQ-10) 100 MG CAPS, CoQ-10, Disp: , Rfl:  .  fexofenadine (ALLEGRA ALLERGY) 180 MG tablet, Take 1 tablet (180 mg total) by mouth daily., Disp: 90 tablet, Rfl: 11 .  fluticasone furoate-vilanterol (BREO ELLIPTA) 200-25 MCG/INH AEPB, Inhale 1 puff into the lungs daily., Disp: 2 each, Rfl: 11   Julian Hy, DO [facility] 01/13/2020 3:36 PM

## 2020-01-13 NOTE — Telephone Encounter (Signed)
Yes changing the dose to decrease to low-dose Tillie Fantasia, DO 01/13/20 7:05 PM [facility]

## 2020-01-13 NOTE — Telephone Encounter (Signed)
Spoke with Caryl Pina at [facility]  She states that they have always filled the Breo 200 for the pt  Today rx was sent for the Breo 100  The Breo 200 no longer on his med list  Pharmacist was to verify that we are changing his dose  Please advise, thanks!

## 2020-01-14 NOTE — Telephone Encounter (Signed)
Called and spoke with pharmacist and notified needs to be Breo 100  Nothing further needed

## 2020-02-13 DIAGNOSIS — N138 Other obstructive and reflux uropathy: Secondary | ICD-10-CM | POA: Diagnosis not present

## 2020-03-09 DIAGNOSIS — D692 Other nonthrombocytopenic purpura: Secondary | ICD-10-CM | POA: Diagnosis not present

## 2020-03-09 DIAGNOSIS — L821 Other seborrheic keratosis: Secondary | ICD-10-CM | POA: Diagnosis not present

## 2020-03-09 DIAGNOSIS — D2222 Melanocytic nevi of left ear and external auricular canal: Secondary | ICD-10-CM | POA: Diagnosis not present

## 2020-05-04 DIAGNOSIS — N138 Other obstructive and reflux uropathy: Secondary | ICD-10-CM | POA: Diagnosis not present

## 2020-05-27 DIAGNOSIS — Z0000 Encounter for general adult medical examination without abnormal findings: Secondary | ICD-10-CM | POA: Diagnosis not present

## 2020-05-27 DIAGNOSIS — Z9181 History of falling: Secondary | ICD-10-CM | POA: Diagnosis not present

## 2020-05-27 DIAGNOSIS — Z139 Encounter for screening, unspecified: Secondary | ICD-10-CM | POA: Diagnosis not present

## 2020-06-08 DIAGNOSIS — Z0000 Encounter for general adult medical examination without abnormal findings: Secondary | ICD-10-CM | POA: Diagnosis not present

## 2020-07-22 DIAGNOSIS — Z961 Presence of intraocular lens: Secondary | ICD-10-CM | POA: Diagnosis not present

## 2020-08-11 ENCOUNTER — Encounter: Payer: Self-pay | Admitting: Internal Medicine

## 2020-08-11 ENCOUNTER — Other Ambulatory Visit: Payer: Self-pay

## 2020-08-11 ENCOUNTER — Ambulatory Visit: Payer: Medicare Other | Admitting: Internal Medicine

## 2020-08-11 VITALS — BP 162/68 | HR 71 | Ht 70.0 in | Wt 181.0 lb

## 2020-08-11 DIAGNOSIS — J339 Nasal polyp, unspecified: Secondary | ICD-10-CM | POA: Diagnosis not present

## 2020-08-11 DIAGNOSIS — J4530 Mild persistent asthma, uncomplicated: Secondary | ICD-10-CM

## 2020-08-11 NOTE — Progress Notes (Signed)
SEICHI KAUFHOLD    [id number]    08-30-1940  Primary Care Physician:Schultz, Lora Havens, MD Date of Appointment: 08/11/2020 Established Patient Visit  Chief complaint:   Chief Complaint  Patient presents with  . Asthma     HPI: Ivan Day is a 80 y.o. gentleman with history of asthma and nasal polyposis. Asthma diagnosed in 2020 with multiple rounds of prednisone.  Interval Updates: Former patient of Dr. Carlis Abbott, [place] care with me today. History of nasal polyposis, last removed in May 2021 by Dr. Venetia Maxon in [place]. Asthma well controlled after switching down from 200 to 100.   Current Regimen: Breo 100, prn albuterol - minimal use.  Asthma Triggers: pollen Exacerbations in the last year: none in the last year History of hospitalization or intubation: none Allergy Testing: yes - had in 2020, low level reactivity to mold GERD: yes, relieved with tums, only occasional Allergic Rhinitis: has history of nasal polyposis ACT:  Asthma Control Test ACT Total Score  08/11/2020 24  01/13/2020 25  08/11/2019 25   FeNO: never had   I have reviewed the patient's family social and past medical history and updated as appropriate.   Past Medical History:  Diagnosis Date  . Asthma   . Brain tumor (benign) ([place])   . COPD (chronic obstructive pulmonary disease) ([place])   . Prostate cancer [facility])     Past Surgical History:  Procedure Laterality Date  . BRAIN SURGERY     dermoid tumor removed  . PROSTATECTOMY      Family History  Problem Relation Age of Onset  . Heart attack Mother   . Diabetes Mother   . Coronary artery disease Father   . Diabetes Sister   . Heart attack Brother   . Diabetes Sister   . Allergic rhinitis Daughter   . Asthma Daughter   . Angioedema Neg Hx   . Eczema Neg Hx   . Immunodeficiency Neg Hx   . Urticaria Neg Hx     Social History   Occupational History  . Not on file  Tobacco Use  . Smoking status: Never Smoker  .  Smokeless tobacco: Never Used  Vaping Use  . Vaping Use: Never used  Substance and Sexual Activity  . Alcohol use: Never  . Drug use: Not Currently  . Sexual activity: Not on file     Physical Exam: Blood pressure (!) 162/68, pulse 71, height 5\' 10"  (1.778 m), weight 181 lb (82.1 kg), SpO2 97 %.  Gen:      No acute distress ENT:  no nasal polyps, mucus membranes moist Lungs:    No increased respiratory effort, symmetric chest wall excursion, clear to auscultation bilaterally, no wheezes or crackles CV:         Regular rate and rhythm; no murmurs, rubs, or gallops.  No pedal edema   Data Reviewed: Imaging: I have personally reviewed the chest xray August 2020 shows no acute cardiopulmonary process  PFTs:  PFT Results Latest Ref Rng & Units 02/05/2019  FVC-Pre L 3.69  FVC-Predicted Pre % 91  FVC-Post L 3.69  FVC-Predicted Post % 91  Pre FEV1/FVC % % 68  Post FEV1/FCV % % 73  FEV1-Pre L 2.50  FEV1-Predicted Pre % 86  FEV1-Post L 2.69  DLCO uncorrected ml/min/mmHg 28.23  DLCO UNC% % 116  DLVA Predicted % 119  TLC L 6.57  TLC % Predicted % 96  RV % Predicted % 125  I have personally reviewed the patient's PFTs show mild airflow limitation. Elevated diffusion capacity. Consistent with asthma.  Labs: Lab Results  Component Value Date   WBC 8.4 11/11/2018   HGB 14.7 11/11/2018   HCT 43.5 11/11/2018   MCV 91.6 11/11/2018   PLT 264.0 11/11/2018   No peripheral eosinophilia IgE elevated at 271   Immunization status: Immunization History  Administered Date(s) Administered  . Fluad Quad(high Dose 65+) 01/25/2019, 01/09/2020  . Influenza,inj,quad, With Preservative 01/19/2018  . Influenza-Unspecified 01/28/2018  . PFIZER(Purple Top)SARS-COV-2 Vaccination 04/19/2019, 05/10/2019    Assessment:  Mild persistent asthma, well controlled, Nasal polyposis, well controlled.  Thrush  Plan/Recommendations: Surveyor, mining. Continue albuterol Switch to gargling with  etoh based mouthwash for thrush  Return to Care: Return in about 6 months (around 02/11/2021).   Lenice Llamas, MD Pulmonary and [place]

## 2020-08-11 NOTE — Patient Instructions (Signed)
The patient should have follow up scheduled with myself in 6 months.   Continue Breo - gargle with an alcohol based mouthwash after use.  Continue albuterol as needed.

## 2020-12-16 DIAGNOSIS — R300 Dysuria: Secondary | ICD-10-CM | POA: Diagnosis not present

## 2020-12-25 ENCOUNTER — Other Ambulatory Visit: Payer: Self-pay | Admitting: Critical Care Medicine

## 2020-12-26 ENCOUNTER — Other Ambulatory Visit: Payer: Self-pay | Admitting: *Deleted

## 2020-12-26 MED ORDER — FLUTICASONE FUROATE-VILANTEROL 100-25 MCG/INH IN AEPB: 1.0000 | INHALATION_SPRAY | Freq: Every day | RESPIRATORY_TRACT | 6 refills | Status: DC

## 2021-01-20 DIAGNOSIS — Z23 Encounter for immunization: Secondary | ICD-10-CM | POA: Diagnosis not present

## 2021-02-07 ENCOUNTER — Ambulatory Visit: Payer: Medicare Other | Admitting: Internal Medicine

## 2021-02-10 ENCOUNTER — Encounter: Payer: Self-pay | Admitting: Internal Medicine

## 2021-02-10 ENCOUNTER — Other Ambulatory Visit: Payer: Self-pay

## 2021-02-10 ENCOUNTER — Ambulatory Visit: Payer: Medicare Other | Admitting: Internal Medicine

## 2021-02-10 VITALS — BP 150/62 | HR 78 | Temp 98.1°F | Ht 69.0 in | Wt 178.8 lb

## 2021-02-10 DIAGNOSIS — J339 Nasal polyp, unspecified: Secondary | ICD-10-CM | POA: Diagnosis not present

## 2021-02-10 DIAGNOSIS — B370 Candidal stomatitis: Secondary | ICD-10-CM | POA: Diagnosis not present

## 2021-02-10 DIAGNOSIS — J4540 Moderate persistent asthma, uncomplicated: Secondary | ICD-10-CM

## 2021-02-10 NOTE — Progress Notes (Signed)
Ivan Day    [id number]    1940/11/03  Primary Care Physician:Schultz, Lora Havens, MD Date of Appointment: 02/10/2021 Established Patient Visit  Chief complaint:   Chief Complaint  Patient presents with   Follow-up    asthma      HPI: Ivan Day is a 80 y.o. gentleman with history of asthma and nasal polyposis. Asthma diagnosed in 2020 with multiple rounds of prednisone.  History of nasal polyposis, last removed in May 2021 by Dr. Venetia Maxon in [place].  Interval Updates: Here for follow up. No interval hospitalizations or ED visits.  Almost no albuterol prn use.   Able to stay active and blow the leaves, wears a mask to help with the fall allergen expsoure.   Current Regimen: Breo 100, prn albuterol - minimal use.  Asthma Triggers: pollen, seasonal allergies, URI Exacerbations in the last year: none in the last year History of hospitalization or intubation: none Allergy Testing: yes - had in 2020, low level reactivity to mold GERD: yes, relieved with tums, only occasional Allergic Rhinitis: has history of nasal polyposis ACT:  Asthma Control Test ACT Total Score  02/10/2021 25  08/11/2020 24  01/13/2020 25   FeNO: never had   I have reviewed the patient's family social and past medical history and updated as appropriate.   Past Medical History:  Diagnosis Date   Asthma    Brain tumor (benign) ([place])    COPD (chronic obstructive pulmonary disease) ([facility])    Prostate cancer ([facility])     Past Surgical History:  Procedure Laterality Date   BRAIN SURGERY     dermoid tumor removed   PROSTATECTOMY      Family History  Problem Relation Age of Onset   Heart attack Mother    Diabetes Mother    Coronary artery disease Father    Diabetes Sister    Heart attack Brother    Diabetes Sister    Allergic rhinitis Daughter    Asthma Daughter    Angioedema Neg Hx    Eczema Neg Hx    Immunodeficiency Neg Hx    Urticaria Neg Hx     Social History    Occupational History   Not on file  Tobacco Use   Smoking status: Never   Smokeless tobacco: Never  Vaping Use   Vaping Use: Never used  Substance and Sexual Activity   Alcohol use: Never   Drug use: Not Currently   Sexual activity: Not on file     Physical Exam: Blood pressure (!) 150/62, pulse 78, temperature 98.1 F (36.7 C), temperature source Oral, height 5\' 9"  (1.753 m), weight 178 lb 12.8 oz (81.1 kg), SpO2 98 %.  Gen:     No distress ENT:  small right posterior polyp, mild thrush Lungs:    ctab no wheezes or crackles CV:         RRR no mrg   Data Reviewed: Imaging: I have personally reviewed the chest xray August 2020 shows no acute cardiopulmonary process  PFTs:   PFT Results Latest Ref Rng & Units 02/05/2019  FVC-Pre L 3.69  FVC-Predicted Pre % 91  FVC-Post L 3.69  FVC-Predicted Post % 91  Pre FEV1/FVC % % 68  Post FEV1/FCV % % 73  FEV1-Pre L 2.50  FEV1-Predicted Pre % 86  FEV1-Post L 2.69  DLCO uncorrected ml/min/mmHg 28.23  DLCO UNC% % 116  DLVA Predicted % 119  TLC L 6.57  TLC %  Predicted % 96  RV % Predicted % 125   I have personally reviewed the patient's PFTs show mild airflow limitation. Elevated diffusion capacity. Consistent with asthma.  Labs: Lab Results  Component Value Date   WBC 8.4 11/11/2018   HGB 14.7 11/11/2018   HCT 43.5 11/11/2018   MCV 91.6 11/11/2018   PLT 264.0 11/11/2018   No peripheral eosinophilia IgE elevated at 271   Immunization status: Immunization History  Administered Date(s) Administered   Fluad Quad(high Dose 65+) 01/25/2019, 01/09/2020   Influenza,inj,quad, With Preservative 01/19/2018   Influenza-Unspecified 01/28/2018   PFIZER(Purple Top)SARS-COV-2 Vaccination 04/19/2019, 05/10/2019    Assessment:  Mild persistent asthma, well controlled, Nasal polyposis with progression Thrush, mild   Plan/Recommendations: Continue Breo 100 once daily Continue albuterol as needed Continue gargling with  etoh based mouth wash. If thrush worsesn can try myclex lozenges.  Keep an eye on polyp in right nare. Start flonase if symptomatic. May consider dupixent if progression or worsening.   Up to date on flu shot.   Return to Care: Return in about 6 months (around 08/10/2021).   Lenice Llamas, MD Pulmonary and [place]

## 2021-02-10 NOTE — Patient Instructions (Signed)
Please schedule follow up scheduled with myself in 6 months.  If my schedule is not open yet, we will contact you with a reminder closer to that time.  Continue the Breo. If you're having problems with thrush call me and we can send in an anti-fungal therapy.   Let me know if nose is starting to feel congested - start flonase nasal spray if it does.

## 2021-06-09 DIAGNOSIS — Z9181 History of falling: Secondary | ICD-10-CM | POA: Diagnosis not present

## 2021-06-09 DIAGNOSIS — E785 Hyperlipidemia, unspecified: Secondary | ICD-10-CM | POA: Diagnosis not present

## 2021-06-09 DIAGNOSIS — Z139 Encounter for screening, unspecified: Secondary | ICD-10-CM | POA: Diagnosis not present

## 2021-06-09 DIAGNOSIS — Z0000 Encounter for general adult medical examination without abnormal findings: Secondary | ICD-10-CM | POA: Diagnosis not present

## 2021-06-29 DIAGNOSIS — N138 Other obstructive and reflux uropathy: Secondary | ICD-10-CM | POA: Diagnosis not present

## 2021-07-04 ENCOUNTER — Telehealth: Payer: Self-pay | Admitting: Gastroenterology

## 2021-07-04 NOTE — Telephone Encounter (Signed)
Hey Dr. Lyndel Safe,  ? ?Patient daughter [name] called in to schedule colonoscopy. States was a patient of yours in [place] and believes is due for a colonoscopy. Could you please review and advise on scheduling? ? ?Thank you ?

## 2021-07-05 NOTE — Telephone Encounter (Signed)
Colonoscopy for colorectal cancer screening is about 76-80 ?Patient falls into that category ?He did have polyps in 2013. ? ?If he is having any GI problems, needs to be seen in office ?If he is not having any GI problems and daughter wants to get colorectal cancer screening done, then proceed with Cologuard. ?RG ?

## 2021-08-12 ENCOUNTER — Encounter: Payer: Self-pay | Admitting: Internal Medicine

## 2021-08-12 ENCOUNTER — Ambulatory Visit: Payer: Medicare Other | Admitting: Internal Medicine

## 2021-08-12 VITALS — BP 124/70 | HR 80 | Temp 97.8°F | Ht 70.0 in | Wt 179.0 lb

## 2021-08-12 DIAGNOSIS — J4520 Mild intermittent asthma, uncomplicated: Secondary | ICD-10-CM | POA: Diagnosis not present

## 2021-08-12 NOTE — Progress Notes (Signed)
? ?      ?Ivan Day    [id number]    [birth year] ? ?Primary Care Physician:Schultz, Lora Havens, MD ?Date of Appointment: 08/12/2021 ?Established Patient Visit ? ?Chief complaint:   ?Chief Complaint  ?Patient presents with  ? Follow-up  ?  No concerns.  Doing well.  ? ? ? ? ?HPI: ?Ivan Day is a 81 y.o. gentleman with history of asthma and nasal polyposis. Asthma diagnosed in 2020 with multiple rounds of prednisone.  History of nasal polyposis, last removed in May 2021 by Dr. Venetia Maxon in [place]. ? ?Interval Updates: ?Here for follow up.  ?No hospitalizations or ED visits.  ?Having some knee pain. Might need procedure or surgery for this.  ?No sinus or allergy issues  ? ?Current Regimen: Breo 100, prn albuterol - minimal use.  ?Asthma Triggers: pollen, seasonal allergies, URI ?Exacerbations in the last year: none in the last year ?History of hospitalization or intubation: none ?Allergy Testing: yes - had in 2020, low level reactivity to mold ?GERD: yes, relieved with tums, only occasional ?Allergic Rhinitis: has history of nasal polyposis ?ACT:  ?Asthma Control Test ACT Total Score  ?08/12/2021 ? 2:27 PM 24  ?02/10/2021 ? 1:45 PM 25  ?08/11/2020 ? 2:39 PM 24  ? ?FeNO: never had ? ? ?I have reviewed the patient's family social and past medical history and updated as appropriate.  ? ?Past Medical History:  ?Diagnosis Date  ? Asthma   ? Brain tumor (benign) ([place])   ? COPD (chronic obstructive pulmonary disease) ([place])   ? Prostate cancer ([place])   ? ? ?Past Surgical History:  ?Procedure Laterality Date  ? BRAIN SURGERY    ? dermoid tumor removed  ? PROSTATECTOMY    ? ? ?Family History  ?Problem Relation Age of Onset  ? Heart attack Mother   ? Diabetes Mother   ? Coronary artery disease Father   ? Diabetes Sister   ? Heart attack Brother   ? Diabetes Sister   ? Allergic rhinitis Daughter   ? Asthma Daughter   ? Angioedema Neg Hx   ? Eczema Neg Hx   ? Immunodeficiency Neg Hx   ? Urticaria Neg Hx   ? ? ?Social  History  ? ?Occupational History  ? Not on file  ?Tobacco Use  ? Smoking status: Never  ? Smokeless tobacco: Never  ?Vaping Use  ? Vaping Use: Never used  ?Substance and Sexual Activity  ? Alcohol use: Never  ? Drug use: Not Currently  ? Sexual activity: Not on file  ? ? ? ?Physical Exam: ?Blood pressure 124/70, pulse 80, temperature 97.8 ?F (36.6 ?C), temperature source Oral, height '5\' 10"'$  (1.778 m), weight 179 lb (81.2 kg), SpO2 96 %. ? ?Gen:     No distress ?Lungs:    ctab no wheezes or crackles ?CV:         RRR no mrg ? ? ?Data Reviewed: ?Imaging: ?I have personally reviewed the chest xray August 2020 shows no acute cardiopulmonary process ? ?PFTs: ? ? ? ?  Latest Ref Rng & Units 02/05/2019  ?  3:05 PM  ?PFT Results  ?FVC-Pre L 3.69    ?FVC-Predicted Pre % 91    ?FVC-Post L 3.69    ?FVC-Predicted Post % 91    ?Pre FEV1/FVC % % 68    ?Post FEV1/FCV % % 73    ?FEV1-Pre L 2.50    ?FEV1-Predicted Pre % 86    ?FEV1-Post L 2.69    ?  DLCO uncorrected ml/min/mmHg 28.23    ?DLCO UNC% % 116    ?DLVA Predicted % 119    ?TLC L 6.57    ?TLC % Predicted % 96    ?RV % Predicted % 125    ? ?I have personally reviewed the patient's PFTs show mild airflow limitation. Elevated diffusion capacity. Consistent with asthma. ? ?Labs: ?Lab Results  ?Component Value Date  ? WBC 8.4 11/11/2018  ? HGB 14.7 11/11/2018  ? HCT 43.5 11/11/2018  ? MCV 91.6 11/11/2018  ? PLT 264.0 11/11/2018  ? ?No peripheral eosinophilia ?IgE elevated at 271 ? ? ?Immunization status: ?Immunization History  ?Administered Date(s) Administered  ? Fluad Quad(high Dose 65+) 01/25/2019, 01/09/2020, 12/31/2020  ? Influenza,inj,quad, With Preservative 01/19/2018  ? Influenza-Unspecified 01/28/2018  ? PFIZER(Purple Top)SARS-COV-2 Vaccination 04/19/2019, 05/10/2019  ? ? ?Assessment:  ?Mild persistent asthma, well controlled ?Nasal polyposis, stable ? ? ?Plan/Recommendations: ?Continue Breo 100 once daily. Can try prn ICS-LABA use as well.  ?Continue albuterol as  needed ? ?Return to Care: ?Return in about 1 year (around 08/13/2022). ? ? ?Lenice Llamas, MD ?Pulmonary and Critical Care Medicine ?[place] ?Office:[phone] ? ? ? ? ? ?

## 2021-08-12 NOTE — Patient Instructions (Addendum)
Please schedule follow up scheduled with myself in 1 year.  If my schedule is not open yet, we will contact you with a reminder closer to that time. Please call [phone] if you haven't heard from [place] a month before.  ? ?I am comfortable with you moving to [facility] as needed, or every other day. If symptoms worsen you can go back up to daily.  ? ?Please call me if any questions or concerns about breathing before 1 year.  ? ?

## 2021-08-25 ENCOUNTER — Other Ambulatory Visit: Payer: Self-pay | Admitting: Internal Medicine

## 2021-08-31 DIAGNOSIS — N138 Other obstructive and reflux uropathy: Secondary | ICD-10-CM | POA: Diagnosis not present

## 2021-09-15 DIAGNOSIS — M25562 Pain in left knee: Secondary | ICD-10-CM | POA: Diagnosis not present

## 2021-09-15 DIAGNOSIS — M1711 Unilateral primary osteoarthritis, right knee: Secondary | ICD-10-CM | POA: Diagnosis not present

## 2021-09-29 DIAGNOSIS — Z86010 Personal history of colonic polyps: Secondary | ICD-10-CM | POA: Diagnosis not present

## 2021-11-01 DIAGNOSIS — M179 Osteoarthritis of knee, unspecified: Secondary | ICD-10-CM

## 2021-11-01 HISTORY — DX: Osteoarthritis of knee, unspecified: M17.9

## 2021-11-03 DIAGNOSIS — M1711 Unilateral primary osteoarthritis, right knee: Secondary | ICD-10-CM | POA: Diagnosis not present

## 2021-11-10 DIAGNOSIS — M1711 Unilateral primary osteoarthritis, right knee: Secondary | ICD-10-CM | POA: Diagnosis not present

## 2021-11-17 DIAGNOSIS — M1711 Unilateral primary osteoarthritis, right knee: Secondary | ICD-10-CM | POA: Diagnosis not present

## 2021-12-29 DIAGNOSIS — M1711 Unilateral primary osteoarthritis, right knee: Secondary | ICD-10-CM | POA: Diagnosis not present

## 2022-01-03 DIAGNOSIS — Z23 Encounter for immunization: Secondary | ICD-10-CM | POA: Diagnosis not present

## 2022-06-23 DIAGNOSIS — E785 Hyperlipidemia, unspecified: Secondary | ICD-10-CM | POA: Diagnosis not present

## 2022-06-23 DIAGNOSIS — R5383 Other fatigue: Secondary | ICD-10-CM | POA: Diagnosis not present

## 2022-06-23 DIAGNOSIS — E559 Vitamin D deficiency, unspecified: Secondary | ICD-10-CM | POA: Diagnosis not present

## 2022-06-23 DIAGNOSIS — Z0000 Encounter for general adult medical examination without abnormal findings: Secondary | ICD-10-CM | POA: Diagnosis not present

## 2022-06-23 DIAGNOSIS — E538 Deficiency of other specified B group vitamins: Secondary | ICD-10-CM | POA: Diagnosis not present

## 2022-06-29 DIAGNOSIS — M4852XA Collapsed vertebra, not elsewhere classified, cervical region, initial encounter for fracture: Secondary | ICD-10-CM | POA: Diagnosis not present

## 2022-06-29 DIAGNOSIS — E538 Deficiency of other specified B group vitamins: Secondary | ICD-10-CM | POA: Diagnosis not present

## 2022-06-29 DIAGNOSIS — M542 Cervicalgia: Secondary | ICD-10-CM | POA: Diagnosis not present

## 2022-06-29 DIAGNOSIS — E559 Vitamin D deficiency, unspecified: Secondary | ICD-10-CM | POA: Diagnosis not present

## 2022-06-29 DIAGNOSIS — M47812 Spondylosis without myelopathy or radiculopathy, cervical region: Secondary | ICD-10-CM | POA: Diagnosis not present

## 2022-07-24 DIAGNOSIS — E538 Deficiency of other specified B group vitamins: Secondary | ICD-10-CM | POA: Diagnosis not present

## 2022-07-24 DIAGNOSIS — K219 Gastro-esophageal reflux disease without esophagitis: Secondary | ICD-10-CM | POA: Diagnosis not present

## 2022-07-24 DIAGNOSIS — E039 Hypothyroidism, unspecified: Secondary | ICD-10-CM | POA: Diagnosis not present

## 2022-07-24 DIAGNOSIS — E559 Vitamin D deficiency, unspecified: Secondary | ICD-10-CM | POA: Diagnosis not present

## 2022-08-14 ENCOUNTER — Encounter: Payer: Self-pay | Admitting: Internal Medicine

## 2022-08-14 ENCOUNTER — Ambulatory Visit: Payer: Medicare Other | Admitting: Internal Medicine

## 2022-08-14 VITALS — BP 120/80 | HR 81 | Temp 97.7°F | Ht 70.0 in | Wt 184.8 lb

## 2022-08-14 DIAGNOSIS — I4949 Other premature depolarization: Secondary | ICD-10-CM | POA: Diagnosis not present

## 2022-08-14 DIAGNOSIS — I4891 Unspecified atrial fibrillation: Secondary | ICD-10-CM

## 2022-08-14 DIAGNOSIS — J4530 Mild persistent asthma, uncomplicated: Secondary | ICD-10-CM

## 2022-08-14 NOTE — Patient Instructions (Addendum)
Please schedule follow up scheduled with myself in 1 year.  If my schedule is not open yet, we will contact you with a reminder closer to that time. Please call [phone] if you haven't heard from [place] a month before.   Continue Breo.  You can back off to as needed use. Take 1 puff once daily as needed during seasons where your breathing is worse, or if you know you're going to be exposed to certain triggers or have been exposed to someone sick. Gargle after use.   Take the albuterol rescue inhaler every 4 to 6 hours as needed for wheezing or shortness of breath. You can also take it 15 minutes before exercise or exertional activity. Side effects include heart racing or pounding, jitters or anxiety. If you have a history of an irregular heart rhythm, it can make this worse. Can also give some patients a hard time sleeping.

## 2022-08-14 NOTE — Progress Notes (Signed)
Ivan Day    [id number]    [birth year]  Primary Care Physician:Schultz, Jonetta Speak, MD Date of Appointment: 08/14/2022 Established Patient Visit  Chief complaint:   Chief Complaint  Patient presents with   Follow-up    Pt states he has been doing good since last visit and denies any complaints. States he has not had any flare ups.      HPI: Ivan Day is a 82 y.o. gentleman with history of asthma and nasal polyposis. Asthma diagnosed in 2020 with multiple rounds of prednisone.  History of nasal polyposis, last removed in May 2021 by Dr. Harlon Flor in [place].  Interval Updates: Here for asthma follow up. Well controlled, no hospitalizations or ED visits. Currently on Breo 100.    Current Regimen: Breo 100, prn albuterol - minimal use.  Asthma Triggers: pollen, seasonal allergies, URI Exacerbations in the last year: none in the last year History of hospitalization or intubation: none Allergy Testing: yes - had in 2020, low level reactivity to mold GERD: yes, relieved with tums, only occasional Allergic Rhinitis: has history of nasal polyposis ACT:  Asthma Control Test ACT Total Score  08/14/2022  2:03 PM 25  08/12/2021  2:27 PM 24  02/10/2021  1:45 PM 25   FeNO: never had   I have reviewed the patient's family social and past medical history and updated as appropriate.   Past Medical History:  Diagnosis Date   Asthma    Brain tumor (benign) ([facility])    COPD (chronic obstructive pulmonary disease) ([facility])    Prostate cancer ([facility])     Past Surgical History:  Procedure Laterality Date   BRAIN SURGERY     dermoid tumor removed   PROSTATECTOMY      Family History  Problem Relation Age of Onset   Heart attack Mother    Diabetes Mother    Coronary artery disease Father    Diabetes Sister    Heart attack Brother    Diabetes Sister    Allergic rhinitis Daughter    Asthma Daughter    Angioedema Neg Hx    Eczema Neg Hx    Immunodeficiency Neg Hx     Urticaria Neg Hx     Social History   Occupational History   Not on file  Tobacco Use   Smoking status: Never   Smokeless tobacco: Never  Vaping Use   Vaping Use: Never used  Substance and Sexual Activity   Alcohol use: Never   Drug use: Not Currently   Sexual activity: Not on file     Physical Exam: Blood pressure 120/80, pulse 81, temperature 97.7 F (36.5 C), temperature source Oral, height 5\' 10"  (1.778 m), weight 184 lb 12.8 oz (83.8 kg), SpO2 98 %.  Gen:     No distress Lungs:    ctab no wheezes or crackles CV:         irregularly irregular   Data Reviewed: Imaging: I have personally reviewed the chest xray August 2020 shows no acute cardiopulmonary process  PFTs:      Latest Ref Rng & Units 02/05/2019    3:05 PM  PFT Results  FVC-Pre L 3.69   FVC-Predicted Pre % 91   FVC-Post L 3.69   FVC-Predicted Post % 91   Pre FEV1/FVC % % 68   Post FEV1/FCV % % 73   FEV1-Pre L 2.50   FEV1-Predicted Pre % 86   FEV1-Post L 2.69   DLCO  uncorrected ml/min/mmHg 28.23   DLCO UNC% % 116   DLVA Predicted % 119   TLC L 6.57   TLC % Predicted % 96   RV % Predicted % 125    I have personally reviewed the patient's PFTs show mild airflow limitation. Elevated diffusion capacity. Consistent with asthma.  Labs: Lab Results  Component Value Date   WBC 8.4 11/11/2018   HGB 14.7 11/11/2018   HCT 43.5 11/11/2018   MCV 91.6 11/11/2018   PLT 264.0 11/11/2018   No peripheral eosinophilia IgE elevated at 271   Immunization status: Immunization History  Administered Date(s) Administered   Fluad Quad(high Dose 65+) 01/25/2019, 01/09/2020, 12/31/2020   Influenza,inj,quad, With Preservative 01/19/2018   Influenza-Unspecified 01/28/2018   PFIZER(Purple Top)SARS-COV-2 Vaccination 04/19/2019, 05/10/2019    Assessment:  Mild persistent asthma, well controlled Nasal polyposis, stable Irregular beats  Plan/Recommendations: Continue Breo 100 but ok to de-escalate to  prn use.  Continue albuterol as needed  He did have a couple of ectopic beats on auscultation so I did obtain an EKG today which showed NSR. He will follow up with PCP.   Return to Care: Return in about 1 year (around 08/14/2023).   Durel Salts, MD Pulmonary and Critical Care Medicine [facility] Office:[phone]

## 2022-08-23 DIAGNOSIS — I4891 Unspecified atrial fibrillation: Secondary | ICD-10-CM | POA: Diagnosis not present

## 2022-08-23 DIAGNOSIS — I4892 Unspecified atrial flutter: Secondary | ICD-10-CM | POA: Diagnosis not present

## 2022-08-23 DIAGNOSIS — E039 Hypothyroidism, unspecified: Secondary | ICD-10-CM | POA: Diagnosis not present

## 2022-08-23 DIAGNOSIS — E538 Deficiency of other specified B group vitamins: Secondary | ICD-10-CM | POA: Diagnosis not present

## 2022-08-23 DIAGNOSIS — E559 Vitamin D deficiency, unspecified: Secondary | ICD-10-CM | POA: Diagnosis not present

## 2022-08-23 DIAGNOSIS — K219 Gastro-esophageal reflux disease without esophagitis: Secondary | ICD-10-CM | POA: Diagnosis not present

## 2022-09-05 ENCOUNTER — Encounter: Payer: Self-pay | Admitting: Cardiology

## 2022-09-05 ENCOUNTER — Encounter: Payer: Self-pay | Admitting: *Deleted

## 2022-10-02 ENCOUNTER — Ambulatory Visit: Payer: Medicare Other | Admitting: Cardiology

## 2022-10-19 ENCOUNTER — Other Ambulatory Visit: Payer: Self-pay

## 2022-10-19 DIAGNOSIS — K449 Diaphragmatic hernia without obstruction or gangrene: Secondary | ICD-10-CM | POA: Insufficient documentation

## 2022-10-19 DIAGNOSIS — D332 Benign neoplasm of brain, unspecified: Secondary | ICD-10-CM | POA: Insufficient documentation

## 2022-10-19 DIAGNOSIS — J45909 Unspecified asthma, uncomplicated: Secondary | ICD-10-CM | POA: Insufficient documentation

## 2022-10-19 DIAGNOSIS — J449 Chronic obstructive pulmonary disease, unspecified: Secondary | ICD-10-CM | POA: Insufficient documentation

## 2022-10-19 DIAGNOSIS — E559 Vitamin D deficiency, unspecified: Secondary | ICD-10-CM | POA: Insufficient documentation

## 2022-10-19 DIAGNOSIS — E039 Hypothyroidism, unspecified: Secondary | ICD-10-CM | POA: Insufficient documentation

## 2022-10-20 ENCOUNTER — Ambulatory Visit: Payer: Medicare Other | Admitting: Cardiology

## 2022-10-20 DIAGNOSIS — N138 Other obstructive and reflux uropathy: Secondary | ICD-10-CM | POA: Diagnosis not present

## 2022-10-20 DIAGNOSIS — N200 Calculus of kidney: Secondary | ICD-10-CM | POA: Diagnosis not present

## 2022-10-21 NOTE — Progress Notes (Unsigned)
Cardiology Office Note:    Date:  10/23/2022   ID:  Ivan Day, DOB 01/03/1941, MRN [id number]  PCP:  Paulina Fusi, MD  Cardiologist:  Norman Herrlich, MD   Referring MD: Paulina Fusi, MD  ASSESSMENT:    1. APC (atrial premature contractions)   2. Mild intermittent asthma without complication   3. Hypothyroidism, unspecified type    PLAN:    In order of problems listed above:  Fortunately is no documented atrial fibrillation. 1 week ZIO monitor to screen heart rhythm His daughter purchased a mobile Kardia device and will check spot his heart rate at home several times a week screening for atrial fibrillation He does not require suppressive therapy or antiarrhythmic or anticoagulant at this time Continue his current thyroid supplement Stable asthma using his bronchodilator as needed  Next appointment 3 months  Medication Adjustments/Labs and Tests Ordered: Current medicines are reviewed at length with the patient today.  Concerns regarding medicines are outlined above.  Orders Placed This Encounter  Procedures   EKG 12-Lead   No orders of the defined types were placed in this encounter.    History of Present Illness:    Ivan Day is a 82 y.o. male who is being seen today for the evaluation of atrial fibrillation at the request of Paulina Fusi, MD.  Review shows no previous cardiology evaluation or diagnostic cardiac testing in epic.  He was seen with his primary care physician 08/23/2022 with a history of hypothyroidism hiatal hernia asthma and dyslipidemia.  He was in sinus rhythm that day and with a history of transient atrial fibrillation and flutter referred to cardiology.  EKG from that date independently reviewed by me sinus rhythm normal  His wife and daughter present participated in the evaluation decision making He was seen by his pulmonologist pulse was felt to be irregular referred to his family doctor with concerns of atrial  fibrillation He has no known history of heart disease congenital rheumatic heart failure or previous atrial fibrillation He has not had edema shortness of breath chest pain palpitation or syncope His EKG today shows sinus rhythm with APCs His asthma markedly improved and he is using his bronchodilator as needed He takes no over-the-counter proarrhythmic drugs and I gave him the list of medicines to avoid and which he can use safely to avoid proarrhythmia  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   Brain tumor (benign) (HCC)    COPD (chronic obstructive pulmonary disease) (HCC)    Elevated prostate specific antigen (PSA) 11/21/2013   Hallux valgus 11/21/2013   Hammer toe 11/21/2013   Hiatal hernia    Hypothyroidism, unspecified    Moderate persistent asthma 10/21/2018   Chest imaging:  04/2018 CXR reviewed showing normal pulmonary parenchyma, no cardiac abnormality     PFT:  November 2019 spirometry at doctors up in [place] showed ratio of 68%, FEV1 of 2.14 L 70% predicted which improved to 2.21 L and a normal ratio 72% predicted after bronchodilator, some improvement in small airways     Exhaled NO 05/2018: 15 ppb   Onychomycosis due to dermatophyte 11/21/2013   Osteoarthritis of knee 11/01/2021   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  Added automatically from request for surgery [id number]   Vitamin D deficiency    Wheezing 10/21/2018    Past Surgical History:  Procedure Laterality Date   BRAIN SURGERY  1999   dermoid tumor removed   CATARACT EXTRACTION Left 04/2010   CATARACT EXTRACTION Right 02/2010   LITHOTRIPSY  2018   NASAL SINUS SURGERY  08/04/2019   NOSE SURGERY  1953   Cyst    PROSTATECTOMY  02/22/2016   Radical, done by Dr. Tommie Ard    Current Medications: Current Meds  Medication Sig   Cholecalciferol (D3-1000) 25 MCG (1000 UT) capsule Take 1,000 Units by mouth daily.   Coenzyme Q10 (COQ-10) 100 MG CAPS Take 100 mg by mouth daily.   Cyanocobalamin (VITAMIN B 12 PO) Take 1,000 mcg by mouth daily.   esomeprazole (NEXIUM) 40 MG capsule Take 40 mg by mouth daily.   famotidine (PEPCID) 40 MG tablet Take 40 mg by mouth daily.   levothyroxine (SYNTHROID) 75 MCG tablet Take 75 mcg by mouth daily.   venlafaxine XR (EFFEXOR-XR) 75 MG 24 hr capsule Take 75 mg by mouth daily.   Vitamin D, Ergocalciferol, (DRISDOL) 1.25 MG (50000 UNIT) CAPS capsule Take 50,000 Units by mouth once a week.     Allergies:   Naproxen and Sulfamethoxazole-trimethoprim   Social History   Socioeconomic History   Marital status: Married    Spouse name: Not on file   Number of children: Not on file   Years of education: Not on file   Highest education level: Not on file  Occupational History   Not on file  Tobacco Use   Smoking status: Never   Smokeless tobacco: Never  Vaping Use   Vaping status: Never Used  Substance and Sexual Activity   Alcohol use: Never   Drug use: Not Currently   Sexual activity: Not on file  Other Topics Concern   Not on file  Social History Narrative   Not on file   Social Determinants of Health   Financial Resource Strain: Not on file  Food Insecurity: Not on file  Transportation Needs: Not on file  Physical Activity: Not on file  Stress: Not on file  Social Connections: Unknown (08/08/2021)   Received from [facility]   Social Network    Social Network: Not on file     Family History: The patient's family history includes Allergic rhinitis in his daughter; Alzheimer's disease in his sister; Asthma in his daughter; Coronary artery disease in his father; Diabetes in his maternal grandmother, mother, sister, and sister; Heart  attack in his brother and mother; Heart disease in his brother; Hypertension in his sister. There is no history of Angioedema, Eczema, Immunodeficiency, or Urticaria.  ROS:   ROS Please see the history of present illness.     All other systems reviewed and are negative.  EKGs/Labs/Other Studies Reviewed:    The following studies were reviewed today: EKG Interpretation Date/Time:  Monday October 23 2022 14:17:34 EDT Ventricular Rate:  84 PR Interval:  164 QRS Duration:  88 QT Interval:  380 QTC Calculation: 449 R Axis:   2  Text Interpretation: Sinus rhythm with Premature atrial complexes Minimal voltage criteria for LVH, may be normal variant ( R in aVL ) No previous ECGs available Confirmed by Norman Herrlich ([id number]) on 10/23/2022 2:27:29 PM     Physical Exam:    VS:  BP 138/72   Pulse 84   Ht 5\' 10"  (1.778 m)   Wt  185 lb (83.9 kg)   SpO2 94%   BMI 26.54 kg/m     Wt Readings from Last 3 Encounters:  10/23/22 185 lb (83.9 kg)  08/23/22 184 lb (83.5 kg)  08/14/22 184 lb 12.8 oz (83.8 kg)     GEN:  Well nourished, well developed in no acute distress HEENT: Normal NECK: No JVD; No carotid bruits LYMPHATICS: No lymphadenopathy CARDIAC: RRR, no murmurs, rubs, gallops RESPIRATORY:  Clear to auscultation without rales, wheezing or rhonchi  ABDOMEN: Soft, non-tender, non-distended MUSCULOSKELETAL:  No edema; No deformity  SKIN: Warm and dry NEUROLOGIC:  Alert and oriented x 3 PSYCHIATRIC:  Normal affect     Signed, Norman Herrlich, MD  10/23/2022 2:59 PM     [facility]

## 2022-10-23 ENCOUNTER — Ambulatory Visit: Payer: Medicare Other | Attending: Cardiology | Admitting: Cardiology

## 2022-10-23 ENCOUNTER — Encounter: Payer: Self-pay | Admitting: Cardiology

## 2022-10-23 ENCOUNTER — Ambulatory Visit: Payer: Medicare Other | Admitting: Cardiology

## 2022-10-23 ENCOUNTER — Ambulatory Visit: Payer: Medicare Other | Attending: Cardiology

## 2022-10-23 VITALS — BP 138/72 | HR 84 | Ht 70.0 in | Wt 185.0 lb

## 2022-10-23 DIAGNOSIS — I491 Atrial premature depolarization: Secondary | ICD-10-CM

## 2022-10-23 DIAGNOSIS — E039 Hypothyroidism, unspecified: Secondary | ICD-10-CM | POA: Diagnosis not present

## 2022-10-23 DIAGNOSIS — J4520 Mild intermittent asthma, uncomplicated: Secondary | ICD-10-CM | POA: Diagnosis not present

## 2022-10-23 NOTE — Patient Instructions (Addendum)
Medication Instructions:  Your physician recommends that you continue on your current medications as directed. Please refer to the Current Medication list given to you today.  *If you need a refill on your cardiac medications before your next appointment, please call your pharmacy*   Lab Work: None If you have labs (blood work) drawn today and your tests are completely normal, you will receive your results only by: [place] (if you have MyChart) OR A paper copy in the mail If you have any lab test that is abnormal or we need to change your treatment, we will call you to review the results.   Testing/Procedures: A zio monitor was ordered today. It will remain on for 7 days. You will then return monitor and event diary in provided box. It takes 1-2 weeks for report to be downloaded and returned to [place]. We will call you with the results. If monitor falls off or has orange flashing light, please call Zio for further instructions.     Follow-Up: At [facility], you and your health needs are our priority.  As part of our continuing mission to provide you with exceptional heart care, we have created designated Provider Care Teams.  These Care Teams include your primary Cardiologist (physician) and Advanced Practice Providers (APPs -  Physician Assistants and Nurse Practitioners) who all work together to provide you with the care you need, when you need it.  We recommend signing up for the patient portal called "MyChart".  Sign up information is provided on this After Visit Summary.  MyChart is used to connect with patients for Virtual Visits (Telemedicine).  Patients are able to view lab/test results, encounter notes, upcoming appointments, etc.  Non-urgent messages can be sent to your provider as well.   To learn more about what you can do with MyChart, go to [email or web address].    Your next appointment:   3 month(s)  Provider:   Shirlee More, MD    Other  Instructions None   1. Avoid all over-the-counter antihistamines except Claritin/Loratadine and Zyrtec/Cetrizine. 2. Avoid all combination including cold sinus allergies flu decongestant and sleep medications 3. You can use Robitussin DM Mucinex and Mucinex DM for cough. 4. can use Tylenol aspirin ibuprofen and naproxen but no combinations such as sleep or sinus.

## 2022-11-02 DIAGNOSIS — I491 Atrial premature depolarization: Secondary | ICD-10-CM | POA: Diagnosis not present

## 2022-11-02 DIAGNOSIS — J4520 Mild intermittent asthma, uncomplicated: Secondary | ICD-10-CM | POA: Diagnosis not present

## 2022-11-13 DIAGNOSIS — J4520 Mild intermittent asthma, uncomplicated: Secondary | ICD-10-CM | POA: Diagnosis not present

## 2022-11-13 DIAGNOSIS — I491 Atrial premature depolarization: Secondary | ICD-10-CM | POA: Diagnosis not present

## 2022-11-13 DIAGNOSIS — E039 Hypothyroidism, unspecified: Secondary | ICD-10-CM | POA: Diagnosis not present

## 2022-11-16 ENCOUNTER — Telehealth: Payer: Self-pay | Admitting: Cardiology

## 2022-11-16 NOTE — Telephone Encounter (Signed)
Patients daughter informed of results 

## 2022-11-16 NOTE — Telephone Encounter (Signed)
Follow Up:     Daughter would like patient's monitor results please.

## 2022-11-23 DIAGNOSIS — K219 Gastro-esophageal reflux disease without esophagitis: Secondary | ICD-10-CM | POA: Diagnosis not present

## 2022-11-23 DIAGNOSIS — E559 Vitamin D deficiency, unspecified: Secondary | ICD-10-CM | POA: Diagnosis not present

## 2022-11-23 DIAGNOSIS — E039 Hypothyroidism, unspecified: Secondary | ICD-10-CM | POA: Diagnosis not present

## 2022-11-23 DIAGNOSIS — E538 Deficiency of other specified B group vitamins: Secondary | ICD-10-CM | POA: Diagnosis not present

## 2023-01-15 DIAGNOSIS — Z0000 Encounter for general adult medical examination without abnormal findings: Secondary | ICD-10-CM | POA: Diagnosis not present

## 2023-01-15 DIAGNOSIS — Z9181 History of falling: Secondary | ICD-10-CM | POA: Diagnosis not present

## 2023-01-23 NOTE — Progress Notes (Deleted)
Cardiology Office Note:    Date:  01/23/2023   ID:  Ivan Day, DOB [birth year], MRN [id number]  PCP:  Paulina Fusi, MD  Cardiologist:  Norman Herrlich, MD    Referring MD: Paulina Fusi, MD    ASSESSMENT:    No diagnosis found. PLAN:    In order of problems listed above:  ***   Next appointment: ***   Medication Adjustments/Labs and Tests Ordered: Current medicines are reviewed at length with the patient today.  Concerns regarding medicines are outlined above.  No orders of the defined types were placed in this encounter.  No orders of the defined types were placed in this encounter.    History of Present Illness:    Ivan Day is a 82 y.o. male with a hx of atrial premature contractions hypothyroidism asthma and dyslipidemia last seen 10/23/2022.  Following the visit he had an event to report 11/13/2022 he had frequent episodes of SVT longest 20 complexes rates varied between 103 and 169 and no episodes of atrial f fibrillation. Compliance with diet, lifestyle and medications: *** 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   Brain tumor (benign) (HCC)    COPD (chronic obstructive pulmonary disease) (HCC)    Elevated prostate specific antigen (PSA) 11/21/2013   Hallux valgus 11/21/2013   Hammer toe 11/21/2013   Hiatal hernia    Hypothyroidism, unspecified    Moderate persistent asthma 10/21/2018   Chest imaging:  04/2018 CXR reviewed showing normal pulmonary parenchyma, no cardiac abnormality     PFT:  November 2019 spirometry at doctors up in [place] showed ratio of 68%, FEV1 of 2.14 L 70% predicted which improved to 2.21 L and a normal ratio 72% predicted after bronchodilator, some improvement in small airways     Exhaled NO 05/2018: 15 ppb   Onychomycosis due to dermatophyte 11/21/2013   Osteoarthritis of knee 11/01/2021   Perennial allergic rhinitis with a  probable nonallergic component 02/26/2019   Polyp of nasal cavity 02/26/2019   Prostate cancer (HCC)    Sinusitis, maxillary, 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  Added automatically from request for surgery [id number]   Vitamin D deficiency    Wheezing 10/21/2018    Current Medications: Current Meds  Medication Sig   Cholecalciferol (D3-1000) 25 MCG (1000 UT) capsule Take 1,000 Units by mouth daily.   Coenzyme Q10 (COQ-10) 100 MG CAPS Take 100 mg by mouth daily.   Cyanocobalamin (VITAMIN B 12 PO) Take 1,000 mcg by mouth daily.   esomeprazole (NEXIUM) 40 MG capsule Take 40 mg by mouth daily.   famotidine (PEPCID) 40 MG tablet Take 40 mg by mouth daily.   levothyroxine (SYNTHROID) 75 MCG tablet Take 75 mcg by mouth daily.   venlafaxine XR (EFFEXOR-XR) 75 MG 24 hr capsule Take 75 mg by mouth daily.   Vitamin D, Ergocalciferol, (DRISDOL) 1.25 MG (50000 UNIT) CAPS capsule Take 50,000 Units by mouth once a week.      EKGs/Labs/Other Studies Reviewed:    The following studies were reviewed today:  Cardiac Studies & Procedures         MONITORS  LONG TERM MONITOR (3-14 DAYS) 11/13/2022  Narrative Patch Wear Time:  6 days and 20 hours (2024-07-22T15:23:13-0400 to 2024-07-29T12:20:01-399)  Patient had a min HR of 52 bpm, max HR of 169 bpm, and avg HR of 70 bpm. Predominant  underlying rhythm was Sinus Rhythm.  There were no there were no triggered or diary events.  There were no sinus pauses 3 seconds or greater no episodes of second or third-degree AV nodal block.  There were no episodes of atrial fibrillation or flutter.   32 Supraventricular Tachycardia runs occurred, the run with the fastest interval lasting 12 beats with a max rate of 169 bpm, the longest lasting 20 beats with an avg rate of 103 bpm.  Isolated SVEs were frequent (5.0%, 34748), SVE Couplets were rare (<1.0%, 331), and SVE Triplets were rare (<1.0%, 47).  Isolated  VEs were rare (<1.0%), VE Couplets were rare (<1.0%), and no VE Triplets were present.               Recent Labs: No results found for requested labs within last 365 days.  Recent Lipid Panel No results found for: "CHOL", "TRIG", "HDL", "CHOLHDL", "VLDL", "LDLCALC", "LDLDIRECT"  Physical Exam:    VS:  There were no vitals taken for this visit.    Wt Readings from Last 3 Encounters:  10/23/22 185 lb (83.9 kg)  08/23/22 184 lb (83.5 kg)  08/14/22 184 lb 12.8 oz (83.8 kg)     GEN: *** Well nourished, well developed in no acute distress HEENT: Normal NECK: No JVD; No carotid bruits LYMPHATICS: No lymphadenopathy CARDIAC: ***RRR, no murmurs, rubs, gallops RESPIRATORY:  Clear to auscultation without rales, wheezing or rhonchi  ABDOMEN: Soft, non-tender, non-distended MUSCULOSKELETAL:  No edema; No deformity  SKIN: Warm and dry NEUROLOGIC:  Alert and oriented x 3 PSYCHIATRIC:  Normal affect    Signed, Norman Herrlich, MD  01/23/2023 1:01 PM    [facility]

## 2023-01-24 ENCOUNTER — Ambulatory Visit: Payer: Medicare Other | Admitting: Cardiology

## 2023-01-27 DIAGNOSIS — Z23 Encounter for immunization: Secondary | ICD-10-CM | POA: Diagnosis not present

## 2023-02-23 DIAGNOSIS — H9193 Unspecified hearing loss, bilateral: Secondary | ICD-10-CM | POA: Diagnosis not present

## 2023-03-14 NOTE — Progress Notes (Unsigned)
Cardiology Office Note:    Date:  03/15/2023   ID:  Ivan Day, DOB 21-May-1940, MRN [id number]  PCP:  Paulina Fusi, MD  Cardiologist:  Norman Herrlich, MD    Referring MD: Paulina Fusi, MD    ASSESSMENT:    1. APC (atrial premature contractions)   2. PAT (paroxysmal atrial tachycardia) (HCC)   3. Mild intermittent asthma without complication   4. Hypothyroidism, unspecified type    PLAN:    In order of problems listed above:  Fortunately his SVE burden is rare not having atrial fibrillation not having symptomatic PAT for now I would not initiate suppressant therapy or anticoagulation His daughter will begin screening on a regular basis with the mobile Kardia device Elevated blood pressure today she will check him at home with a validated device good technique and bring to her next follow-up with her PCP Continue his current thyroid supplement   Next appointment: I will see back in my office as needed   Medication Adjustments/Labs and Tests Ordered: Current medicines are reviewed at length with the patient today.  Concerns regarding medicines are outlined above.  No orders of the defined types were placed in this encounter.  No orders of the defined types were placed in this encounter.    History of Present Illness:    Ivan Day is a 82 y.o. male with a hx of atrial arrhythmia with SVT and APCs and asthma and hypothyroidism last seen 10/23/2022.  An event monitor reported 11/13/2022 showing sinus rhythm throughout without bradycardia there were frequent episodes of SVT the longest 20 complexes rate of 103 bpm with frequent SVE and rare VE.  Compliance with diet, lifestyle and medications: Yes  His daughter and wife are both present He is no complaints of edema shortness of breath chest pain palpitation or syncope [name] and will be self screening him at home for atrial fibrillation I am also asked him to start checking his  blood pressure record and bring to his next visit with his PCP tomorrow is hesitant to initiate or change antihypertensive therapy with 1 reading in the office He is not having palpitations syncope chest pain edema or shortness of breath  Recent labs 11/23/2022 cholesterol 182 LDL 121 hemoglobin 15.5 creatinine 0.79 potassium 4.1 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   Brain tumor (benign) (HCC)    COPD (chronic obstructive pulmonary disease) (HCC)    Elevated prostate specific antigen (PSA) 11/21/2013   Hallux valgus 11/21/2013   Hammer toe 11/21/2013   Hiatal hernia    Hypothyroidism, unspecified    Moderate persistent asthma 10/21/2018   Chest imaging:  04/2018 CXR reviewed showing normal pulmonary parenchyma, no cardiac abnormality     PFT:  November 2019 spirometry at doctors up in [place] showed ratio of 68%, FEV1 of 2.14 L 70% predicted which improved to 2.21 L and a normal ratio 72% predicted after bronchodilator, some improvement in small airways     Exhaled NO 05/2018: 15 ppb   Onychomycosis due to dermatophyte 11/21/2013   Osteoarthritis of knee 11/01/2021   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  Added automatically from request for surgery [id number]   Vitamin  D deficiency    Wheezing 10/21/2018    Current Medications: Current Meds  Medication Sig   Cholecalciferol (D3-1000) 25 MCG (1000 UT) capsule Take 1,000 Units by mouth daily.   Coenzyme Q10 (COQ-10) 100 MG CAPS Take 100 mg by mouth daily.   Cyanocobalamin (VITAMIN B 12 PO) Take 1,000 mcg by mouth daily.   esomeprazole (NEXIUM) 40 MG capsule Take 40 mg by mouth daily.   famotidine (PEPCID) 40 MG tablet Take 40 mg by mouth daily.    levothyroxine (SYNTHROID) 75 MCG tablet Take 75 mcg by mouth daily.   venlafaxine XR (EFFEXOR-XR) 75 MG 24 hr capsule Take 75 mg by mouth daily.   Vitamin D, Ergocalciferol, (DRISDOL) 1.25 MG (50000 UNIT) CAPS capsule Take 50,000 Units by mouth once a week.      EKGs/Labs/Other Studies Reviewed:    The following studies were reviewed today:  Cardiac Studies & Procedures        MONITORS  LONG TERM MONITOR (3-14 DAYS) 11/13/2022  Narrative Patch Wear Time:  6 days and 20 hours (2024-07-22T15:23:13-0400 to 2024-07-29T12:20:01-399)  Patient had a min HR of 52 bpm, max HR of 169 bpm, and avg HR of 70 bpm. Predominant underlying rhythm was Sinus Rhythm.  There were no there were no triggered or diary events.  There were no sinus pauses 3 seconds or greater no episodes of second or third-degree AV nodal block.  There were no episodes of atrial fibrillation or flutter.   32 Supraventricular Tachycardia runs occurred, the run with the fastest interval lasting 12 beats with a max rate of 169 bpm, the longest lasting 20 beats with an avg rate of 103 bpm.  Isolated SVEs were frequent (5.0%, 34748), SVE Couplets were rare (<1.0%, 331), and SVE Triplets were rare (<1.0%, 47).  Isolated VEs were rare (<1.0%), VE Couplets were rare (<1.0%), and no VE Triplets were present.             Physical Exam:    VS:  BP (!) 150/76 (BP Location: Right Arm, Patient Position: Sitting)   Pulse 83   Ht 5\' 10"  (1.778 m)   Wt 186 lb 9.6 oz (84.6 kg)   SpO2 90%   BMI 26.77 kg/m     Wt Readings from Last 3 Encounters:  03/15/23 186 lb 9.6 oz (84.6 kg)  10/23/22 185 lb (83.9 kg)  08/23/22 184 lb (83.5 kg)     GEN:  Well nourished, well developed in no acute distress HEENT: Normal NECK: No JVD; No carotid bruits LYMPHATICS: No lymphadenopathy CARDIAC: RRR, no murmurs, rubs, gallops RESPIRATORY:  Clear to auscultation without rales, wheezing or rhonchi  ABDOMEN: Soft, non-tender,  non-distended MUSCULOSKELETAL:  No edema; No deformity  SKIN: Warm and dry NEUROLOGIC:  Alert and oriented x 3 PSYCHIATRIC:  Normal affect    Signed, Norman Herrlich, MD  03/15/2023 5:10 PM    [facility]

## 2023-03-15 ENCOUNTER — Encounter: Payer: Self-pay | Admitting: Cardiology

## 2023-03-15 ENCOUNTER — Ambulatory Visit: Payer: Medicare Other | Attending: Cardiology | Admitting: Cardiology

## 2023-03-15 VITALS — BP 150/76 | HR 83 | Ht 70.0 in | Wt 186.6 lb

## 2023-03-15 DIAGNOSIS — I4719 Other supraventricular tachycardia: Secondary | ICD-10-CM

## 2023-03-15 DIAGNOSIS — I491 Atrial premature depolarization: Secondary | ICD-10-CM | POA: Diagnosis not present

## 2023-03-15 DIAGNOSIS — E039 Hypothyroidism, unspecified: Secondary | ICD-10-CM | POA: Diagnosis not present

## 2023-03-15 DIAGNOSIS — J4520 Mild intermittent asthma, uncomplicated: Secondary | ICD-10-CM

## 2023-03-15 NOTE — Patient Instructions (Signed)
Medication Instructions:  Your physician recommends that you continue on your current medications as directed. Please refer to the Current Medication list given to you today.  *If you need a refill on your cardiac medications before your next appointment, please call your pharmacy*   Lab Work: None If you have labs (blood work) drawn today and your tests are completely normal, you will receive your results only by: MyChart Message (if you have MyChart) OR A paper copy in the mail If you have any lab test that is abnormal or we need to change your treatment, we will call you to review the results.   Testing/Procedures: None   Follow-Up: At [facility], you and your health needs are our priority.  As part of our continuing mission to provide you with exceptional heart care, we have created designated Provider Care Teams.  These Care Teams include your primary Cardiologist (physician) and Advanced Practice Providers (APPs -  Physician Assistants and Nurse Practitioners) who all work together to provide you with the care you need, when you need it.  We recommend signing up for the patient portal called "MyChart".  Sign up information is provided on this After Visit Summary.  MyChart is used to connect with patients for Virtual Visits (Telemedicine).  Patients are able to view lab/test results, encounter notes, upcoming appointments, etc.  Non-urgent messages can be sent to your provider as well.   To learn more about what you can do with MyChart, go to [email or web address].    Your next appointment:   Follow up as needed  Provider:   Brian Munley, MD    Other Instructions None  

## 2023-04-20 DIAGNOSIS — H903 Sensorineural hearing loss, bilateral: Secondary | ICD-10-CM | POA: Diagnosis not present

## 2023-04-20 DIAGNOSIS — Z01118 Encounter for examination of ears and hearing with other abnormal findings: Secondary | ICD-10-CM | POA: Diagnosis not present

## 2023-04-20 DIAGNOSIS — H9313 Tinnitus, bilateral: Secondary | ICD-10-CM | POA: Diagnosis not present

## 2023-05-15 DIAGNOSIS — H905 Unspecified sensorineural hearing loss: Secondary | ICD-10-CM | POA: Diagnosis not present

## 2023-06-28 DIAGNOSIS — M25562 Pain in left knee: Secondary | ICD-10-CM | POA: Diagnosis not present

## 2023-06-28 DIAGNOSIS — M1711 Unilateral primary osteoarthritis, right knee: Secondary | ICD-10-CM | POA: Diagnosis not present

## 2023-07-03 DIAGNOSIS — E039 Hypothyroidism, unspecified: Secondary | ICD-10-CM | POA: Diagnosis not present

## 2023-07-03 DIAGNOSIS — D519 Vitamin B12 deficiency anemia, unspecified: Secondary | ICD-10-CM | POA: Diagnosis not present

## 2023-07-03 DIAGNOSIS — K219 Gastro-esophageal reflux disease without esophagitis: Secondary | ICD-10-CM | POA: Diagnosis not present

## 2023-07-03 DIAGNOSIS — E559 Vitamin D deficiency, unspecified: Secondary | ICD-10-CM | POA: Diagnosis not present

## 2023-07-06 DIAGNOSIS — M1711 Unilateral primary osteoarthritis, right knee: Secondary | ICD-10-CM | POA: Diagnosis not present

## 2023-07-13 DIAGNOSIS — M1711 Unilateral primary osteoarthritis, right knee: Secondary | ICD-10-CM | POA: Diagnosis not present

## 2023-08-15 DIAGNOSIS — W57XXXA Bitten or stung by nonvenomous insect and other nonvenomous arthropods, initial encounter: Secondary | ICD-10-CM | POA: Diagnosis not present

## 2023-08-15 DIAGNOSIS — L089 Local infection of the skin and subcutaneous tissue, unspecified: Secondary | ICD-10-CM | POA: Diagnosis not present

## 2023-08-15 DIAGNOSIS — S20462A Insect bite (nonvenomous) of left back wall of thorax, initial encounter: Secondary | ICD-10-CM | POA: Diagnosis not present

## 2023-09-10 DIAGNOSIS — M6740 Ganglion, unspecified site: Secondary | ICD-10-CM | POA: Diagnosis not present

## 2023-09-10 DIAGNOSIS — L814 Other melanin hyperpigmentation: Secondary | ICD-10-CM | POA: Diagnosis not present

## 2023-09-10 DIAGNOSIS — D229 Melanocytic nevi, unspecified: Secondary | ICD-10-CM | POA: Diagnosis not present

## 2023-09-10 DIAGNOSIS — L570 Actinic keratosis: Secondary | ICD-10-CM | POA: Diagnosis not present

## 2023-09-10 DIAGNOSIS — D1801 Hemangioma of skin and subcutaneous tissue: Secondary | ICD-10-CM | POA: Diagnosis not present

## 2023-09-10 DIAGNOSIS — D485 Neoplasm of uncertain behavior of skin: Secondary | ICD-10-CM | POA: Diagnosis not present

## 2023-09-10 DIAGNOSIS — L821 Other seborrheic keratosis: Secondary | ICD-10-CM | POA: Diagnosis not present

## 2023-09-10 DIAGNOSIS — L578 Other skin changes due to chronic exposure to nonionizing radiation: Secondary | ICD-10-CM | POA: Diagnosis not present

## 2023-09-25 DIAGNOSIS — M2011 Hallux valgus (acquired), right foot: Secondary | ICD-10-CM | POA: Diagnosis not present

## 2023-09-25 DIAGNOSIS — M79671 Pain in right foot: Secondary | ICD-10-CM | POA: Diagnosis not present

## 2023-09-25 DIAGNOSIS — M2041 Other hammer toe(s) (acquired), right foot: Secondary | ICD-10-CM | POA: Diagnosis not present

## 2023-09-25 DIAGNOSIS — M79672 Pain in left foot: Secondary | ICD-10-CM | POA: Diagnosis not present

## 2023-09-25 DIAGNOSIS — M24478 Recurrent dislocation, left toe(s): Secondary | ICD-10-CM | POA: Diagnosis not present

## 2023-09-25 DIAGNOSIS — M24477 Recurrent dislocation, right toe(s): Secondary | ICD-10-CM | POA: Diagnosis not present

## 2023-09-25 DIAGNOSIS — M7741 Metatarsalgia, right foot: Secondary | ICD-10-CM | POA: Diagnosis not present

## 2023-09-25 DIAGNOSIS — M2012 Hallux valgus (acquired), left foot: Secondary | ICD-10-CM | POA: Diagnosis not present

## 2023-09-25 DIAGNOSIS — M2042 Other hammer toe(s) (acquired), left foot: Secondary | ICD-10-CM | POA: Diagnosis not present

## 2023-09-25 DIAGNOSIS — M7742 Metatarsalgia, left foot: Secondary | ICD-10-CM | POA: Diagnosis not present

## 2023-12-26 DIAGNOSIS — N138 Other obstructive and reflux uropathy: Secondary | ICD-10-CM | POA: Diagnosis not present

## 2023-12-26 DIAGNOSIS — N200 Calculus of kidney: Secondary | ICD-10-CM | POA: Diagnosis not present

## 2024-01-08 DIAGNOSIS — D519 Vitamin B12 deficiency anemia, unspecified: Secondary | ICD-10-CM | POA: Diagnosis not present

## 2024-01-08 DIAGNOSIS — M25512 Pain in left shoulder: Secondary | ICD-10-CM | POA: Diagnosis not present

## 2024-01-08 DIAGNOSIS — M25511 Pain in right shoulder: Secondary | ICD-10-CM | POA: Diagnosis not present

## 2024-01-08 DIAGNOSIS — E559 Vitamin D deficiency, unspecified: Secondary | ICD-10-CM | POA: Diagnosis not present

## 2024-01-08 DIAGNOSIS — E039 Hypothyroidism, unspecified: Secondary | ICD-10-CM | POA: Diagnosis not present

## 2024-01-08 DIAGNOSIS — K219 Gastro-esophageal reflux disease without esophagitis: Secondary | ICD-10-CM | POA: Diagnosis not present

## 2024-03-05 ENCOUNTER — Encounter: Payer: Self-pay | Admitting: Internal Medicine

## 2024-03-05 ENCOUNTER — Ambulatory Visit: Admitting: Internal Medicine

## 2024-03-05 VITALS — BP 122/68 | HR 82 | Temp 98.6°F | Ht 70.0 in | Wt 177.0 lb

## 2024-03-05 DIAGNOSIS — J4520 Mild intermittent asthma, uncomplicated: Secondary | ICD-10-CM

## 2024-03-05 DIAGNOSIS — J339 Nasal polyp, unspecified: Secondary | ICD-10-CM | POA: Diagnosis not present

## 2024-03-05 MED ORDER — FLUTICASONE FUROATE-VILANTEROL 100-25 MCG/ACT IN AEPB: 1.0000 | INHALATION_SPRAY | Freq: Every day | RESPIRATORY_TRACT | 3 refills | Status: AC | PRN

## 2024-03-05 NOTE — Progress Notes (Signed)
 Ivan Day    [id number]    10/26/1940  Primary Care Physician:Schultz, Vicenta BRAVO, MD Date of Appointment: 03/05/2024 Established Patient Visit  Chief complaint:   Chief Complaint  Patient presents with   Asthma    Stuffy nose.  Breathing is doing well      HPI: Ivan Day is a 83 y.o. gentleman with history of asthma and nasal polyposis. Asthma diagnosed in 2020 with multiple rounds of prednisone .  History of nasal polyposis, last removed in May 2021 by Dr. Cyrus in [place].  Interval Updates: Here for asthma follow up.  Prn breo use, used it only once in the last year. No interval exacerbations requiring prednisone .   Nasal polyps doing ok - he does have some nasal congestion but does not currently take any nasal sprays. Denies anosmia.   Followed up with PCP and he had no issues with his HR.    Current Regimen: Breo 100, prn albuterol  - minimal use.  Asthma Triggers: pollen, seasonal allergies, URI Exacerbations in the last year: none in the last year History of hospitalization or intubation: none Allergy Testing: yes - had in 2020, low level reactivity to mold GERD: yes, relieved with tums, only occasional Allergic Rhinitis: has history of nasal polyposis ACT:  Asthma Control Test ACT Total Score  08/14/2022  2:03 PM 25  08/12/2021  2:27 PM 24  02/10/2021  1:45 PM 25   FeNO: never had   I have reviewed the patient's family social and past medical history and updated as appropriate.   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   Brain tumor (benign) (HCC)    COPD (chronic obstructive pulmonary disease) (HCC)    Elevated prostate specific antigen (PSA) 11/21/2013   Hallux valgus 11/21/2013   Hammer toe 11/21/2013   Hiatal hernia    Hypothyroidism, unspecified    Moderate persistent asthma 10/21/2018   Chest imaging:  04/2018 CXR reviewed showing  normal pulmonary parenchyma, no cardiac abnormality     PFT:  November 2019 spirometry at doctors up in [place] showed ratio of 68%, FEV1 of 2.14 L 70% predicted which improved to 2.21 L and a normal ratio 72% predicted after bronchodilator, some improvement in small airways     Exhaled NO 05/2018: 15 ppb   Onychomycosis due to dermatophyte 11/21/2013   Osteoarthritis of knee 11/01/2021   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  Added automatically from request for surgery [id number]   Vitamin D deficiency    Wheezing 10/21/2018    Past Surgical History:  Procedure Laterality Date   BRAIN SURGERY  1999   dermoid tumor removed   CATARACT EXTRACTION Left 04/2010   CATARACT EXTRACTION Right 02/2010   LITHOTRIPSY  2018   NASAL SINUS SURGERY  08/04/2019   NOSE SURGERY  1953   Cyst   PROSTATECTOMY  02/22/2016   Radical, done by Dr. Deward Stalling    Family History  Problem Relation Age of Onset   Heart attack Mother    Diabetes Mother    Coronary artery disease Father    Diabetes Sister    Alzheimer's disease Sister    Hypertension Sister    Diabetes  Sister    Heart attack Brother    Heart disease Brother    Diabetes Maternal Grandmother    Allergic rhinitis Daughter    Asthma Daughter    Angioedema Neg Hx    Eczema Neg Hx    Immunodeficiency Neg Hx    Urticaria Neg Hx     Social History   Occupational History   Not on file  Tobacco Use   Smoking status: Never   Smokeless tobacco: Never  Vaping Use   Vaping status: Never Used  Substance and Sexual Activity   Alcohol use: Never   Drug use: Not Currently   Sexual activity: Not on file     Physical Exam: Blood pressure 122/68, pulse 82, temperature 98.6 F (37 C), temperature source Oral, height 5' 10 (1.778 m),  weight 177 lb (80.3 kg), SpO2 97%.  Gen:     No distress, well appearing ENT: no nasal polyps Lungs:   ctab no wheeze CV:         RRR   Data Reviewed: Imaging: I have personally reviewed the chest xray August 2020 shows no acute cardiopulmonary process  PFTs:      Latest Ref Rng & Units 02/05/2019    3:05 PM  PFT Results  FVC-Pre L 3.69   FVC-Predicted Pre % 91   FVC-Post L 3.69   FVC-Predicted Post % 91   Pre FEV1/FVC % % 68   Post FEV1/FCV % % 73   FEV1-Pre L 2.50   FEV1-Predicted Pre % 86   FEV1-Post L 2.69   DLCO uncorrected ml/min/mmHg 28.23   DLCO UNC% % 116   DLVA Predicted % 119   TLC L 6.57   TLC % Predicted % 96   RV % Predicted % 125    I have personally reviewed the patient's PFTs show mild airflow limitation. Elevated diffusion capacity. Consistent with asthma.  Labs: Lab Results  Component Value Date   WBC 8.4 11/11/2018   HGB 14.7 11/11/2018   HCT 43.5 11/11/2018   MCV 91.6 11/11/2018   PLT 264.0 11/11/2018   No peripheral eosinophilia IgE elevated at 271   Immunization status: Immunization History  Administered Date(s) Administered   Fluad Quad(high Dose 65+) 01/25/2019, 01/09/2020, 12/31/2020   Fluad Trivalent(High Dose 65+) 01/27/2023   Influenza,inj,quad, With Preservative 01/19/2018   Influenza-Unspecified 01/28/2018, 01/31/2024   PFIZER(Purple Top)SARS-COV-2 Vaccination 04/19/2019, 05/10/2019    Assessment:  Mild intermittent asthma, well controlled Nasal polyposis, in remission  Plan/Recommendations: I am glad your breathing is doing well.  Continue the Breo on an as needed basis.  You can take 1 puff once a day as needed for symptoms of chest pain, tightness, wheezing, shortness of breath.  If you are taking it multiple times a week and still not having relief of your symptoms please come back and see us  sooner.  Signs of nasal polyposis include worsening nasal congestion, sinus headaches, loss of smell.  If these symptoms recur  please start taking nasal steroid spray again and follow-up with your ENT.    Return to Care: Return if symptoms worsen or fail to improve.   Verdon Gore, MD Pulmonary and Critical Care Medicine [facility] Office:[phone]

## 2024-03-05 NOTE — Patient Instructions (Signed)
 It was a pleasure to see you today!  For now we can follow up as needed.  Please call sooner [phone] if issues or concerns arise. You can also send us  a message through MyChart, but but aware that this is not to be used for urgent issues and it may take up to 5-7 days to receive a reply. Please be aware that you will likely be able to view your results before I have a chance to respond to them. Please give us  5 business days to respond to any non-urgent results.    I am glad your breathing is doing well.  Continue the Breo on an as needed basis.  You can take 1 puff once a day as needed for symptoms of chest pain, tightness, wheezing, shortness of breath.  If you are taking it multiple times a week and still not having relief of your symptoms please come back and see us  sooner.  Signs of nasal polyposis include worsening nasal congestion, sinus headaches, loss of smell.  If these symptoms recur please start taking nasal steroid spray again and follow-up with your ENT.
